# Patient Record
Sex: Female | Born: 1970 | Hispanic: Yes | Marital: Married | Smoking: Never smoker
Health system: Southern US, Community
[De-identification: ages and names within clinical notes are randomized; demographics above are authoritative.]

## PROBLEM LIST (undated history)

## (undated) DIAGNOSIS — E669 Obesity, unspecified: Secondary | ICD-10-CM

## (undated) DIAGNOSIS — G43909 Migraine, unspecified, not intractable, without status migrainosus: Secondary | ICD-10-CM

## (undated) DIAGNOSIS — I639 Cerebral infarction, unspecified: Secondary | ICD-10-CM

## (undated) DIAGNOSIS — K219 Gastro-esophageal reflux disease without esophagitis: Secondary | ICD-10-CM

## (undated) HISTORY — PX: TUBAL LIGATION: SHX77

## (undated) HISTORY — DX: Cerebral infarction, unspecified: I63.9

## (undated) HISTORY — PX: CHOLECYSTECTOMY: SHX55

---

## 2008-02-03 ENCOUNTER — Ambulatory Visit: Payer: Self-pay | Admitting: *Deleted

## 2008-02-03 DIAGNOSIS — E669 Obesity, unspecified: Secondary | ICD-10-CM | POA: Insufficient documentation

## 2008-02-03 DIAGNOSIS — K219 Gastro-esophageal reflux disease without esophagitis: Secondary | ICD-10-CM | POA: Insufficient documentation

## 2008-02-03 LAB — CONVERTED CEMR LAB: TSH: 0.69 microintl units/mL (ref 0.35–5.50)

## 2008-03-22 ENCOUNTER — Encounter: Payer: Self-pay | Admitting: Family Medicine

## 2008-03-22 ENCOUNTER — Ambulatory Visit: Payer: Self-pay | Admitting: Family Medicine

## 2008-03-22 DIAGNOSIS — R209 Unspecified disturbances of skin sensation: Secondary | ICD-10-CM | POA: Insufficient documentation

## 2008-03-23 LAB — CONVERTED CEMR LAB
Folate: 19.1 ng/mL
HCT: 45 % (ref 36.0–46.0)
Hemoglobin: 14.5 g/dL (ref 12.0–15.0)
MCHC: 32.2 g/dL (ref 30.0–36.0)
MCV: 89.1 fL (ref 78.0–100.0)
RBC: 5.05 M/uL (ref 3.87–5.11)
Vit D, 1,25-Dihydroxy: 13 — ABNORMAL LOW (ref 30–89)
WBC: 9.6 10*3/uL (ref 4.0–10.5)

## 2008-03-27 ENCOUNTER — Encounter: Admission: RE | Admit: 2008-03-27 | Discharge: 2008-03-27 | Payer: Self-pay | Admitting: Family Medicine

## 2008-03-31 ENCOUNTER — Ambulatory Visit: Payer: Self-pay | Admitting: Family Medicine

## 2008-04-10 ENCOUNTER — Ambulatory Visit: Payer: Self-pay | Admitting: Family Medicine

## 2008-04-27 ENCOUNTER — Ambulatory Visit: Payer: Self-pay | Admitting: Family Medicine

## 2008-04-27 DIAGNOSIS — G43109 Migraine with aura, not intractable, without status migrainosus: Secondary | ICD-10-CM | POA: Insufficient documentation

## 2008-06-08 ENCOUNTER — Ambulatory Visit: Payer: Self-pay | Admitting: Family Medicine

## 2008-07-19 ENCOUNTER — Telehealth: Payer: Self-pay | Admitting: Family Medicine

## 2008-07-19 DIAGNOSIS — E559 Vitamin D deficiency, unspecified: Secondary | ICD-10-CM | POA: Insufficient documentation

## 2008-08-23 ENCOUNTER — Encounter: Payer: Self-pay | Admitting: Family Medicine

## 2009-03-13 ENCOUNTER — Other Ambulatory Visit: Admission: RE | Admit: 2009-03-13 | Discharge: 2009-03-13 | Payer: Self-pay | Admitting: Family Medicine

## 2009-03-13 ENCOUNTER — Ambulatory Visit: Payer: Self-pay | Admitting: Family Medicine

## 2009-03-13 ENCOUNTER — Encounter: Payer: Self-pay | Admitting: Family Medicine

## 2009-03-14 ENCOUNTER — Encounter: Payer: Self-pay | Admitting: Family Medicine

## 2009-03-15 ENCOUNTER — Encounter: Payer: Self-pay | Admitting: Family Medicine

## 2009-03-15 LAB — CONVERTED CEMR LAB
ALT: 66 units/L — ABNORMAL HIGH (ref 0–35)
Alkaline Phosphatase: 68 units/L (ref 39–117)
Calcium: 9.4 mg/dL (ref 8.4–10.5)
Creatinine, Ser: 0.66 mg/dL (ref 0.40–1.20)
LDL Cholesterol: 120 mg/dL — ABNORMAL HIGH (ref 0–99)
Potassium: 4.3 meq/L (ref 3.5–5.3)
Total CHOL/HDL Ratio: 3.5
Total Protein: 7.2 g/dL (ref 6.0–8.3)

## 2009-03-17 LAB — CONVERTED CEMR LAB
HCV Ab: NEGATIVE
Hep B C IgM: NEGATIVE

## 2009-03-19 LAB — CONVERTED CEMR LAB: Pap Smear: NORMAL

## 2009-03-21 ENCOUNTER — Encounter: Payer: Self-pay | Admitting: Family Medicine

## 2009-03-22 ENCOUNTER — Encounter: Payer: Self-pay | Admitting: Family Medicine

## 2009-03-23 LAB — CONVERTED CEMR LAB
ALT: 21 units/L (ref 0–35)
AST: 15 units/L (ref 0–37)

## 2009-05-23 ENCOUNTER — Ambulatory Visit: Payer: Self-pay | Admitting: Family Medicine

## 2009-05-23 DIAGNOSIS — Q828 Other specified congenital malformations of skin: Secondary | ICD-10-CM | POA: Insufficient documentation

## 2009-07-09 ENCOUNTER — Ambulatory Visit: Payer: Self-pay | Admitting: Family Medicine

## 2009-07-09 DIAGNOSIS — K648 Other hemorrhoids: Secondary | ICD-10-CM | POA: Insufficient documentation

## 2009-12-10 ENCOUNTER — Telehealth: Payer: Self-pay | Admitting: Family Medicine

## 2010-01-31 ENCOUNTER — Telehealth: Payer: Self-pay | Admitting: Family Medicine

## 2010-04-19 ENCOUNTER — Other Ambulatory Visit
Admission: RE | Admit: 2010-04-19 | Discharge: 2010-04-19 | Payer: Self-pay | Source: Home / Self Care | Admitting: Family Medicine

## 2010-04-19 ENCOUNTER — Ambulatory Visit: Payer: Self-pay | Admitting: Family Medicine

## 2010-04-24 LAB — CONVERTED CEMR LAB
Pap Smear: NEGATIVE
Pap Smear: NORMAL

## 2010-05-24 ENCOUNTER — Ambulatory Visit
Admission: RE | Admit: 2010-05-24 | Discharge: 2010-05-24 | Payer: Self-pay | Source: Home / Self Care | Attending: Family Medicine | Admitting: Family Medicine

## 2010-05-24 DIAGNOSIS — M25569 Pain in unspecified knee: Secondary | ICD-10-CM | POA: Insufficient documentation

## 2010-06-18 NOTE — Progress Notes (Signed)
Summary: change  bcp  Phone Note Call from Patient Call back at [phone]   Caller: Patient Call For: Nani Gasser MD Summary of Call: pt needs a refill of her BCP but wants to switch to another type Initial call taken by: Avon Gully CMA, Duncan Dull),  December 10, 2009 9:26 AM  Follow-up for Phone Call        That is fine. Does she have a preference or is she having problems with her current pill?   Follow-up by: Nani Gasser MD,  December 10, 2009 12:25 PM  Additional Follow-up for Phone Call Additional follow up Details #1::        pt does not know the name of the BCP she had in the past. It was before she was a pt at this office. Pt wants a pill that allows her to have regular full periods. states"wants old fashion" bcp.send to [facility] Additional Follow-up by: Avon Gully CMA, Duncan Dull),  December 10, 2009 12:53 PM    New/Updated Medications: SPRINTEC 28 0.25-35 MG-MCG TABS (NORGESTIMATE-ETH ESTRADIOL) Take 1 tablet by mouth once a day Prescriptions: SPRINTEC 28 0.25-35 MG-MCG TABS (NORGESTIMATE-ETH ESTRADIOL) Take 1 tablet by mouth once a day  #1 pack x 6   Entered and Authorized by:   Nani Gasser MD   Signed by:   Nani Gasser MD on 12/10/2009   Method used:   Electronically to        [facility] [id number] (retail)       [address]       [place], [place]  [address]       Ph: [phone] or [phone]       Fax: [phone]   RxID:   [id number]

## 2010-06-18 NOTE — Letter (Signed)
Summary: Out of Work  [facility]  [address], [address]   [place], [place] [address]   Phone: [phone]  Fax: [phone]    May 23, 2009   Employee:  Brandy Brady    To Whom It May Concern:   For Medical reasons, please excuse the above named employee from work for the following dates:  Start:   Jan 5-6  End:   Jan 7  If you need additional information, please feel free to contact our office.         Sincerely,    Seymour Bars DO

## 2010-06-18 NOTE — Assessment & Plan Note (Signed)
Summary: Cpe W/Pap   Vital Signs:  Patient profile:   40 year old female Menstrual status:  regular Height:      64.5 inches Weight:      225 pounds Pulse rate:   79 / minute BP sitting:   130 / 84  (right arm) Cuff size:   large  Vitals Entered By: Avon Gully CMA, Duncan Dull) (April 19, 2010 1:23 PM) CC: CPE and pap   Primary Care Shakai Dolley:  Nani Gasser MD  CC:  CPE and pap.  History of Present Illness: No complaints. She is trying to get pregnant. She has 3 kids and has been off OCPs for 2 months thus far. Periods are regular.   Current Medications (verified): 1)  None  Allergies (verified): No Known Drug Allergies  Comments:  Nurse/Medical Assistant: The patient's medications and allergies were reviewed with the patient and were updated in the Medication and Allergy Lists. Avon Gully CMA, Duncan Dull) (April 19, 2010 1:24 PM)  Past History:  Past Medical History: Last updated: 07/09/2009 migraines GERD Peptic ulcer disease Keratosis pilaris.  Colonoscopy/endoscopy 1990s   Past Surgical History: Last updated: 03/22/2008 Cholecystectomy 02/1999  Family History: Last updated: 03/13/2009 Family History of Arthritis Family History Diabetes 1st degree relative - Father  Family History Hypertension Family History Lung cancer, mom died at age 18.  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  Bachelors degree in [employer].  Married to [employer] with 3 kids.   3 children Never Smoked Alcohol use-no Drug use-no Regular exercise-no  Review of Systems  The patient denies anorexia, fever, weight loss, weight gain, vision loss, decreased hearing, hoarseness, chest pain, syncope, dyspnea on exertion, peripheral edema, prolonged cough, headaches, hemoptysis, abdominal pain, melena, hematochezia, severe indigestion/heartburn, hematuria, incontinence, genital sores, muscle  weakness, suspicious skin lesions, transient blindness, difficulty walking, depression, unusual weight change, abnormal bleeding, enlarged lymph nodes, and breast masses.    Physical Exam  General:  Well-developed,well-nourished,in no acute distress; alert,appropriate and cooperative throughout examination Head:  Normocephalic and atraumatic without obvious abnormalities. No apparent alopecia or balding. Eyes:  No corneal or conjunctival inflammation noted. EOMI. Perrla. Ears:  External ear exam shows no significant lesions or deformities.  Otoscopic examination reveals clear canals, tympanic membranes are intact bilaterally without bulging, retraction, inflammation or discharge. Hearing is grossly normal bilaterally. Nose:  External nasal examination shows no deformity or inflammation.  Mouth:  Oral mucosa and oropharynx without lesions or exudates.  Teeth in good repair. Neck:  No deformities, masses, or tenderness noted. Chest Wall:  No deformities, masses, or tenderness noted. Breasts:  No mass, nodules, thickening, tenderness, bulging, retraction, inflamation, nipple discharge or skin changes noted.   Lungs:  Normal respiratory effort, chest expands symmetrically. Lungs are clear to auscultation, no crackles or wheezes. Heart:  Normal rate and regular rhythm. S1 and S2 normal without gallop, murmur, click, rub or other extra sounds. Abdomen:  Bowel sounds positive,abdomen soft and non-tender without masses, organomegaly or hernias noted. Genitalia:  Normal introitus for age, no external lesions, no vaginal discharge, mucosa pink and moist, no vaginal or cervical lesions, no vaginal atrophy, no friaility or hemorrhage, normal uterus size and position, no adnexal masses or tenderness Msk:  No deformity or scoliosis noted of thoracic or lumbar spine.   Pulses:  R and L carotid,radial,dorsalis pedis and posterior tibial pulses are full and equal bilaterally Extremities:  No clubbing, cyanosis,  edema, or deformity noted  with normal full range of motion of all joints.   Neurologic:  No cranial nerve deficits noted. Station and gait are normal.. Sensory, motor and coordinative functions appear intact. Skin:  no rashes.   Cervical Nodes:  No lymphadenopathy noted Axillary Nodes:  No palpable lymphadenopathy Psych:  Cognition and judgment appear intact. Alert and cooperative with normal attention span and concentration. No apparent delusions, illusions, hallucinations   Impression & Recommendations:  Problem # 1:  ROUTINE GYNECOLOGICAL EXAMINATION (ICD-V72.31) Rec daily folic acid to prevent birth defects Due for screening labs.  Encouraged regular exercise.  If pap is normal then repeat in 3 years.  Orders: T-Comprehensive Metabolic Panel [id number]) T-Lipid Profile [id number])  Other Orders: T-Vitamin D (25-Hydroxy) [id number])  Patient Instructions: 1)  Make sure taking folic acid daily to help prevent neural tube defects.   2)  Regular exercise is good for you heart 3)  We will call you with your lab results when we get these back.    Orders Added: 1)  Est. Patient age 35-39 [59] 2)  T-Comprehensive Metabolic Panel [[id number]] 3)  T-Lipid Profile [id number] 4)  T-Vitamin D (25-Hydroxy) [phone]

## 2010-06-18 NOTE — Assessment & Plan Note (Signed)
Summary: pink eye/   Vital Signs:  Patient profile:   40 year old female Menstrual status:  regular Height:      64.5 inches Weight:      215 pounds BMI:     36.47 O2 Sat:      100 % on Room air Temp:     98.8 degrees F oral Pulse rate:   83 / minute BP sitting:   132 / 88  (left arm) Cuff size:   large  Vitals Entered By: Payton Spark CMA (May 23, 2009 1:43 PM)  O2 Flow:  Room air CC: L eye infection x 1 day   Primary Care Provider:  Nani Gasser MD  CC:  L eye infection x 1 day.  History of Present Illness: L eye started tearing and getting red yesterday, now in the R eye.  Her eye was swollen and sealed shut this morning.  She went to work today but she left her job today.  No pink eye at home.  She recent URI syptoms.  She does not wear contacts.  Denies foreign bodies.  Has some yellow discharge in the morning.    Current Medications (verified): 1)  Cryselle-28 0.3-30 Mg-Mcg Tabs (Norgestrel-Ethinyl Estradiol) .... Take 1 Tablet By Mouth Once A Day 2)  Treximet 85-500 Mg Tabs (Sumatriptan-Naproxen Sodium) .... Take 1 Tablet By Mouth Once A Day As Needed Migraine 3)  Vitamin D 1000 Unit Tabs (Cholecalciferol) .... Take 1 Tablet By Mouth Once A Day  Allergies (verified): No Known Drug Allergies  Past History:  Past Medical History: Reviewed history from 03/22/2008 and no changes required. migraines GERD Peptic ulcer disease Keratosis pilaris.   Social History: Reviewed history from 03/22/2008 and no changes required. 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  Bachelors degree in [employer].  Married to [employer] with 3 kids.   3 children Never Smoked Alcohol use-no Drug use-no Regular exercise-no  Review of Systems      See HPI  Physical Exam  General:  alert, well-developed, well-nourished, and well-hydrated.   Head:  normocephalic and atraumatic.   Eyes:  global injection to both eyes with minimal L upper eyelid edema.  No mucous discharge. EOMI.   PERRLA.  wears glasses.   Ears:  no external deformities.   Nose:  no nasal discharge.   Mouth:  pharynx pink and moist.   Neck:  no masses.   Lungs:  Normal respiratory effort, chest expands symmetrically. Lungs are clear to auscultation, no crackles or wheezes. Heart:  Normal rate and regular rhythm. S1 and S2 normal without gallop, murmur, click, rub or other extra sounds. Skin:  keratosis pilaris upper arms   Impression & Recommendations:  Problem # 1:  UNSPECIFIED ACUTE CONJUNCTIVITIS (ICD-372.00) Treat bilateral acute conjunctivitis with Tobradex drops 4 x a day, warm moist compresses.  AVoid contact spread.  Problem # 2:  KERATOSIS PILARIS (ICD-757.39) Recommended trying Lac- Hydrin cream.    Complete Medication List: 1)  Cryselle-28 0.3-30 Mg-mcg Tabs (Norgestrel-ethinyl estradiol) .... Take 1 tablet by mouth once a day 2)  Treximet 85-500 Mg Tabs (Sumatriptan-naproxen sodium) .... Take 1 tablet by mouth once a day as needed migraine 3)  Vitamin D 1000 Unit Tabs (Cholecalciferol) .... Take 1 tablet by mouth once a day 4)  Tobramycin-dexamethasone 0.3-0.1 % Susp (Tobramycin-dexamethasone) .Marland Kitchen.. 1-2 drops in both eyes 4 x a day x 1 wk  Patient Instructions: 1)  Start on Tobradex drops 4 x a day for pink eye. 2)  Use warm  moist compresses. 3)  Take tomorrow off if pink eye is not improving.   Prescriptions: TOBRAMYCIN-DEXAMETHASONE 0.3-0.1 % SUSP (TOBRAMYCIN-DEXAMETHASONE) 1-2 drops in both eyes 4 x a day x 1 wk  #1 bottle x 0   Entered and Authorized by:   Seymour Bars DO   Signed by:   Seymour Bars DO on 05/23/2009   Method used:   Electronically to        [facility] [address] (retail)       [address] [place], [place]  [address]       Ph: [phone] or [phone]       Fax: [phone]   RxID:   [id number]

## 2010-06-18 NOTE — Assessment & Plan Note (Signed)
Summary: BLEEDING After BM   Vital Signs:  Patient profile:   40 year old female Menstrual status:  regular Height:      64.5 inches Weight:      220 pounds Pulse rate:   66 / minute BP sitting:   127 / 79  (left arm) Cuff size:   large  Vitals Entered By: Kathlene November (July 09, 2009 12:51 PM) CC: has hx of internal hemorroids and for the last 1 1/2 weeks has had bright red blood in stool, on paper when wipes and in the toilet water   Primary Care Provider:  Nani Gasser MD  CC:  has hx of internal hemorroids and for the last 1 1/2 weeks has had bright red blood in stool and on paper when wipes and in the toilet water.  History of Present Illness: has hx of internal hemorroids and for the last 1 1/2 weeks has had bright red blood in stool, on paper when wipes and in the toilet water.  Has noticed drops in the water.  Has had to strain lately with BMs.  With her new job has been not able to drink as much.  No major diet changes.  No pain w/ the bleeding.    Current Medications (verified): 1)  Cryselle-28 0.3-30 Mg-Mcg Tabs (Norgestrel-Ethinyl Estradiol) .... Take 1 Tablet By Mouth Once A Day 2)  Vitamin B-12 1000 Mcg Tabs (Cyanocobalamin) .... Take One Tablet By Mouth Daily  Allergies (verified): No Known Drug Allergies  Comments:  Nurse/Medical Assistant: The patient's medications and allergies were reviewed with the patient and were updated in the Medication and Allergy Lists. Kathlene November (July 09, 2009 12:53 PM)  Past History:  Past Medical History: migraines GERD Peptic ulcer disease Keratosis pilaris.  Colonoscopy/endoscopy 1990s   Physical Exam  General:  Well-developed,well-nourished,in no acute distress; alert,appropriate and cooperative throughout examination Head:  Normocephalic and atraumatic without obvious abnormalities. No apparent alopecia or balding. Rectal:  + exteranl and internal hemorrhoids but no active bleeding. Hemoccult +. Digital  exam was nromal.    Impression & Recommendations:  Problem # 1:  HEMORRHOIDS, INTERNAL (ICD-455.0) Use topical steroid cream for relief. Also will work on softening stools. Start colace and increase water intake.  Pt to call on Thrusday if not better. If not better then will schedule with GI.   Complete Medication List: 1)  Cryselle-28 0.3-30 Mg-mcg Tabs (Norgestrel-ethinyl estradiol) .... Take 1 tablet by mouth once a day 2)  Vitamin B-12 1000 Mcg Tabs (Cyanocobalamin) .... Take one tablet by mouth daily 3)  Proctosol Hc 2.5 % Crea (Hydrocortisone) .... Apply cream two times a day or after bm.   Patient Instructions: 1)  Colace one with each meal with a glass a water.  2)  If not better by Thursday morning then please call.  Prescriptions: PROCTOSOL HC 2.5 % CREA (HYDROCORTISONE) Apply cream two times a day or after BM.  #1 tube x 0   Entered and Authorized by:   Nani Gasser MD   Signed by:   Nani Gasser MD on 07/09/2009   Method used:   Electronically to        [facility] [address] (retail)       [address] [place], [place]  [address]       Ph: [phone] or [phone]       Fax: [phone]   RxID:  [id number]   Appended Document: BLEEDING After BM HPI: No abdominal pain, nausae or vomiting.

## 2010-06-18 NOTE — Progress Notes (Signed)
Summary: meds  Phone Note Call from Patient   Caller: Patient Call For: Nani Gasser MD Summary of Call: pt wants to know if she should take a folic acid along with her prenatle vitamin because she is trying to have another child Initial call taken by: Avon Gully CMA, Duncan Dull),  January 31, 2010 11:24 AM  Follow-up for Phone Call        Veryl Speak the prenatal has enough folic acid so should be OK but it wants to take additional folic acid certainly won't hurt.  Follow-up by: Nani Gasser MD,  January 31, 2010 12:01 PM  Additional Follow-up for Phone Call Additional follow up Details #1::        pt notified via vm Additional Follow-up by: Avon Gully CMA, Duncan Dull),  January 31, 2010 12:10 PM

## 2010-06-20 NOTE — Assessment & Plan Note (Signed)
Summary: PAIN IN RIGHT KNEE   Vital Signs:  Patient profile:   40 year old female Menstrual status:  regular Height:      64.5 inches Weight:      227 pounds Pulse rate:   84 / minute BP sitting:   125 / 82  (right arm) Cuff size:   large  Vitals Entered By: Avon Gully CMA, Duncan Dull) (May 24, 2010 2:44 PM) CC: rt knee pain since x-mas,tried Aleve   Primary Care Provider:  Nani Gasser MD  CC:  rt knee pain since x-mas and tried Aleve.  History of Present Illness: rt knee pain since x-mas,tried Aleve. No recent trauma or falls. Has hurt in the past and would feel a sharp pain when pushes her wheeled chair.  Suddenly once day stoodup around the [facility] and the pain was excruitiating.  Got better with rest but this week has been worse.  Sharp movements make it worse.  Pain at the base of hte patella . No swelling. No giving out or locks.  Kayren Eaves down the step is more painful then going up the stairs.  Takes an Aleve when needed and helps some. No prior known injury  Current Medications (verified): 1)  None  Allergies (verified): No Known Drug Allergies  Comments:  Nurse/Medical Assistant: The patient's medications and allergies were reviewed with the patient and were updated in the Medication and Allergy Lists. Avon Gully CMA, Duncan Dull) (May 24, 2010 2:46 PM)  Physical Exam  General:  Well-developed,well-nourished,in no acute distress; alert,appropriate and cooperative throughout examination Msk:  Slight tender over the distal end of the patella. No joiint line tendnerness. Neg patellar compresssion test. Nroma flexion, extension. Strength 5/5 bilat at the knee, ankle.  No laxity. No pain with varus and valgus.     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  She may also have some excessive mobility of the knee cap. Discussed dx. Given H.O on stretches. NSAID, elevated, rest and  ice. F/U in 3-4 weeks if not getting some better. Consider PT. She would like to try home exercises first.  Her updated medication list for this problem includes:    Aleve 220 Mg Tabs (Naproxen sodium)  Complete Medication List: 1)  Aleve 220 Mg Tabs (Naproxen sodium)  Patient Instructions: 1)  Icey massage 2)  Aleve two times a day with fooda nd water. 3)  Start the exercises on the handout. 4)  If not significan improvement in 4 weeks then let me know and we can get an xray.    Orders Added: 1)  Est. Patient Level IV [[address]]

## 2010-06-25 ENCOUNTER — Encounter: Payer: Self-pay | Admitting: Family Medicine

## 2010-06-26 LAB — CONVERTED CEMR LAB
ALT: 19 units/L (ref 0–35)
AST: 17 units/L (ref 0–37)
Chloride: 104 meq/L (ref 96–112)
Cholesterol: 181 mg/dL (ref 0–200)
Potassium: 4.3 meq/L (ref 3.5–5.3)
Total CHOL/HDL Ratio: 3.2
Vit D, 25-Hydroxy: 14 ng/mL — ABNORMAL LOW (ref 30–89)

## 2010-07-15 ENCOUNTER — Telehealth: Payer: Self-pay | Admitting: Family Medicine

## 2010-07-25 NOTE — Progress Notes (Signed)
Summary: [facility]--Positive UPT  Phone Note Call from Patient Call back at Home Phone [phone]   Caller: Patient Call For: Nani Gasser MD Summary of Call: Pt had positive UPT this weekend.  Pt would like to know if she needs to make appt with you or if she just needs to schedule with OB/GYN.  Please advise. Initial call taken by: Francee Piccolo CMA Duncan Dull),  July 15, 2010 3:58 PM  Follow-up for Phone Call        Can sched with OB. Tell her congrats.  Follow-up by: Nani Gasser MD,  July 15, 2010 4:38 PM  Additional Follow-up for Phone Call Additional follow up Details #1::        message left with above information.   Additional Follow-up by: Francee Piccolo CMA Duncan Dull),  July 15, 2010 5:23 PM

## 2010-07-26 ENCOUNTER — Other Ambulatory Visit: Payer: Self-pay | Admitting: Family Medicine

## 2010-07-26 ENCOUNTER — Ambulatory Visit (INDEPENDENT_AMBULATORY_CARE_PROVIDER_SITE_OTHER): Payer: Commercial Indemnity | Admitting: Family Medicine

## 2010-07-26 ENCOUNTER — Encounter: Payer: Self-pay | Admitting: Family Medicine

## 2010-07-26 ENCOUNTER — Ambulatory Visit: Payer: Self-pay | Admitting: Family Medicine

## 2010-07-26 DIAGNOSIS — O3680X0 Pregnancy with inconclusive fetal viability, not applicable or unspecified: Secondary | ICD-10-CM

## 2010-07-26 DIAGNOSIS — Z331 Pregnant state, incidental: Secondary | ICD-10-CM

## 2010-07-28 ENCOUNTER — Inpatient Hospital Stay (HOSPITAL_COMMUNITY)
Admission: AD | Admit: 2010-07-28 | Discharge: 2010-07-28 | Disposition: A | Discharge status: Home / Self Care | Payer: Commercial Indemnity | Source: Ambulatory Visit | Attending: Obstetrics and Gynecology | Admitting: Obstetrics and Gynecology

## 2010-07-28 ENCOUNTER — Inpatient Hospital Stay (HOSPITAL_COMMUNITY): Payer: Commercial Indemnity

## 2010-07-28 DIAGNOSIS — O209 Hemorrhage in early pregnancy, unspecified: Secondary | ICD-10-CM | POA: Insufficient documentation

## 2010-07-28 LAB — CBC
MCH: 29.3 pg (ref 26.0–34.0)
MCHC: 32.8 g/dL (ref 30.0–36.0)
RBC: 4.61 MIL/uL (ref 3.87–5.11)
WBC: 5.4 10*3/uL (ref 4.0–10.5)

## 2010-07-28 LAB — WET PREP, GENITAL
Clue Cells Wet Prep HPF POC: NONE SEEN
Yeast Wet Prep HPF POC: NONE SEEN

## 2010-07-29 ENCOUNTER — Ambulatory Visit (HOSPITAL_COMMUNITY): Admission: RE | Admit: 2010-07-29 | Payer: Commercial Indemnity | Source: Ambulatory Visit

## 2010-07-30 ENCOUNTER — Inpatient Hospital Stay (HOSPITAL_COMMUNITY)
Admission: AD | Admit: 2010-07-30 | Discharge: 2010-07-30 | Disposition: A | Discharge status: Home / Self Care | Payer: Commercial Indemnity | Source: Ambulatory Visit | Attending: Obstetrics & Gynecology | Admitting: Obstetrics & Gynecology

## 2010-07-30 DIAGNOSIS — O209 Hemorrhage in early pregnancy, unspecified: Secondary | ICD-10-CM

## 2010-07-30 LAB — GC/CHLAMYDIA PROBE AMP, GENITAL
Chlamydia, DNA Probe: NEGATIVE
GC Probe Amp, Genital: NEGATIVE

## 2010-07-30 LAB — HCG, QUANTITATIVE, PREGNANCY: hCG, Beta Chain, Quant, S: 177 m[IU]/mL — ABNORMAL HIGH (ref <5)

## 2010-07-30 NOTE — Assessment & Plan Note (Signed)
Summary:  spotting  while [redacted] weeks pregnant   Vital Signs:  Patient profile:   40 year old female Menstrual status:  regular LMP:     06/18/2010 Height:      64.5 inches Weight:      223 pounds Pulse rate:   61 / minute BP sitting:   132 / 88  (right arm) Cuff size:   large  Vitals Entered By: Avon Gully CMA, Duncan Dull) (July 26, 2010 2:27 PM) CC: pt concerned she is having a miscarrage, wipped bld on tissue LMP (date): 06/18/2010 LMP - Character: light Menarche (age onset years): 10   Menses interval (days): 28 Menstrual flow (days): 7 Enter LMP: 06/18/2010 Last PAP Result Normal   Primary Care Provider:  Nani Gasser MD  CC:  pt concerned she is having a miscarrage and wipped bld on tissue.  History of Present Illness: LMP was last week of Jan. Thought was going to change OB.  Hasn't seen OB for the pregnanty.  Little red passage of fluids when went to the bathroom today. No cramping.She is 5 weeks 3 days today.  she has not had any vaginal or abdominal pain. she denies any vaginal discharge.  Current Medications (verified): 1)  None  Allergies (verified): No Known Drug Allergies  Comments:  Nurse/Medical Assistant: The patient's medications and allergies were reviewed with the patient and were updated in the Medication and Allergy Lists. Avon Gully CMA, Duncan Dull) (July 26, 2010 2:29 PM)  Physical Exam  General:  Well-developed,well-nourished,in no acute distress; alert,appropriate and cooperative throughout examination   Impression & Recommendations:  Problem # 1:  PREGNANCY (ICD-V22.2)  I discussed with patient that it is possible that she may be losing the pregnancy. I certainly at this point she is really bad she should not be at risk of hemorrhage et Karie Soda. that I let her know she starts having any heavy bleeding over the weekend she is to go to [facility] for [facility] evaluation. We will schedule a pelvic ultrasound on Monday to see if  there is still a viable pregnancy in place. unfortunately she is very disappointed that she may lose this pregnancy I reassured her that sometimes spotting can be normal in the early part of pregnancy. Orders: T-*Unlisted Diagnostic X-ray test/procedure [id number])   Orders Added: 1)  T-*Unlisted Diagnostic X-ray test/procedure [[id number]] 2)  Est. Patient Level III [[address]]

## 2010-08-01 ENCOUNTER — Inpatient Hospital Stay (HOSPITAL_COMMUNITY)
Admission: AD | Admit: 2010-08-01 | Discharge: 2010-08-01 | Disposition: A | Discharge status: Home / Self Care | Payer: Commercial Indemnity | Source: Ambulatory Visit | Attending: Family Medicine | Admitting: Family Medicine

## 2010-08-01 DIAGNOSIS — O039 Complete or unspecified spontaneous abortion without complication: Secondary | ICD-10-CM | POA: Insufficient documentation

## 2010-08-01 LAB — HCG, QUANTITATIVE, PREGNANCY: hCG, Beta Chain, Quant, S: 24 m[IU]/mL — ABNORMAL HIGH (ref <5)

## 2010-08-08 ENCOUNTER — Inpatient Hospital Stay (HOSPITAL_COMMUNITY)
Admission: AD | Admit: 2010-08-08 | Discharge: 2010-08-08 | Disposition: A | Discharge status: Home / Self Care | Payer: Commercial Indemnity | Source: Ambulatory Visit | Attending: Obstetrics and Gynecology | Admitting: Obstetrics and Gynecology

## 2010-08-08 DIAGNOSIS — O039 Complete or unspecified spontaneous abortion without complication: Secondary | ICD-10-CM | POA: Insufficient documentation

## 2010-08-08 LAB — HCG, QUANTITATIVE, PREGNANCY: hCG, Beta Chain, Quant, S: 2 m[IU]/mL (ref <5)

## 2010-08-18 DEATH — deceased

## 2013-04-05 ENCOUNTER — Ambulatory Visit: Payer: Commercial Indemnity | Admitting: Family Medicine

## 2013-04-05 DIAGNOSIS — Z0289 Encounter for other administrative examinations: Secondary | ICD-10-CM

## 2013-09-09 ENCOUNTER — Ambulatory Visit (INDEPENDENT_AMBULATORY_CARE_PROVIDER_SITE_OTHER): Payer: BC Managed Care – PPO | Admitting: Family Medicine

## 2013-09-09 ENCOUNTER — Encounter: Payer: Self-pay | Admitting: Family Medicine

## 2013-09-09 VITALS — BP 117/76 | HR 63 | Ht 63.25 in | Wt 179.0 lb

## 2013-09-09 DIAGNOSIS — R5381 Other malaise: Secondary | ICD-10-CM

## 2013-09-09 DIAGNOSIS — M545 Low back pain, unspecified: Secondary | ICD-10-CM

## 2013-09-09 DIAGNOSIS — R5383 Other fatigue: Principal | ICD-10-CM

## 2013-09-09 DIAGNOSIS — K648 Other hemorrhoids: Secondary | ICD-10-CM

## 2013-09-09 MED ORDER — NORETHIN ACE-ETH ESTRAD-FE 1-20 MG-MCG(24) PO CHEW: 1.0000 | CHEWABLE_TABLET | Freq: Every day | ORAL | Status: DC

## 2013-09-09 NOTE — Patient Instructions (Signed)

## 2013-09-09 NOTE — Progress Notes (Signed)
   Subjective:    Patient ID: Brandy Brady, female    DOB: [birth year], 43 y.o.   MRN: [id number]  HPI Low back pain - Says occ hurts on the left low back. Will occ feel a knot. No specific triggers. It's not hurting today. No real alleviating factors. Sent to get better after a few days on its own. No old injuries or trauma.  Has some problems with constipation esp during pregnancy.  Still some intermittant constipation.  She has to drink a lot of water.  Will occ see bright red blood.   She has a known history of internal hemorrhoids and feels like ever since her pregnancy that they have been irritated and inflamed.  Fatigue - Has been tired in general.   Now has a little boy. Now 16 month.  He was 6 weeks premature.   Needs refill on birth control. Plans to schedule a Pap smear. Her last pelvic exam was about 16 months ago when her son was born. She's happy with her current birth control pill. It has regulated her periods well.   Review of Systems  Constitutional: Negative for fever, diaphoresis and unexpected weight change.  HENT: Negative for hearing loss, postnasal drip, sneezing and tinnitus.   Eyes: Negative for visual disturbance.  Respiratory: Negative for cough and wheezing.   Cardiovascular: Negative for chest pain and palpitations.  Genitourinary: Negative for vaginal bleeding and vaginal discharge.       Blood in BM  Musculoskeletal: Positive for myalgias. Negative for arthralgias.  Neurological: Positive for headaches.  Hematological: Negative for adenopathy. Does not bruise/bleed easily.  Psychiatric/Behavioral:       Sexual dysfunction.        Objective:   Physical Exam  Constitutional: She is oriented to person, place, and time. She appears well-developed and well-nourished.  HENT:  Head: Normocephalic and atraumatic.  Cardiovascular: Normal rate, regular rhythm and normal heart sounds.   Pulmonary/Chest: Effort normal and breath sounds normal.   Musculoskeletal:  Lumbar spine with normal flexion, extension, rotation right and left, side bending. She's very tender over the left SI joint. Nontender over the lumbar spine. Negative straight leg raise bilaterally.  Neurological: She is alert and oriented to person, place, and time.  Skin: Skin is warm and dry.  Psychiatric: She has a normal mood and affect. Her behavior is normal.          Assessment & Plan:  Low back pain-SI joint inflammation and muscular skeletal strain. Recommend anti-inflammatory as needed. Also provided handouts and gentle stretches from her back. Suspect that the inflammation is secondary to her lifting her 7478-month-old. In carrying him on her hip. If suddenly gets worse then please let us know.  Internal hemorrhoids- gave reassurance. Continue work on keeping stools consistent as this does help. Making sure drinking plenty of fluids. If he gets to the point where continues to be bothersome consider surgical evaluation for banding or hemorrhoidectomy.  Fatigue - will check labs to anemia and thyroidd. Slip provided today.  She will schedule a physical w/ pap in the next couple weeks. Birth control refill today.

## 2013-09-23 ENCOUNTER — Encounter: Payer: BC Managed Care – PPO | Admitting: Family Medicine

## 2013-09-29 ENCOUNTER — Encounter: Payer: BC Managed Care – PPO | Admitting: Family Medicine

## 2013-09-29 DIAGNOSIS — Z0289 Encounter for other administrative examinations: Secondary | ICD-10-CM

## 2013-10-20 ENCOUNTER — Other Ambulatory Visit: Payer: Self-pay | Admitting: Family Medicine

## 2013-10-20 DIAGNOSIS — Z139 Encounter for screening, unspecified: Secondary | ICD-10-CM

## 2013-10-21 LAB — COMPLETE METABOLIC PANEL WITH GFR
ALT: 9 U/L (ref 0–35)
AST: 13 U/L (ref 0–37)
Albumin: 4.2 g/dL (ref 3.5–5.2)
Alkaline Phosphatase: 55 U/L (ref 39–117)
BUN: 14 mg/dL (ref 6–23)
CALCIUM: 9.6 mg/dL (ref 8.4–10.5)
CO2: 23 mEq/L (ref 19–32)
CREATININE: 0.7 mg/dL (ref 0.50–1.10)
Chloride: 104 mEq/L (ref 96–112)
GFR, Est Non African American: >89 mL/min
Glucose, Bld: 99 mg/dL (ref 70–99)
POTASSIUM: 4.3 meq/L (ref 3.5–5.3)
SODIUM: 139 meq/L (ref 135–145)
Total Bilirubin: 0.7 mg/dL (ref 0.2–1.2)
Total Protein: 7.1 g/dL (ref 6.0–8.3)

## 2013-10-21 LAB — LIPID PANEL
CHOLESTEROL: 163 mg/dL (ref 0–200)
HDL: 60 mg/dL (ref >39)
LDL CALC: 77 mg/dL (ref 0–99)
TRIGLYCERIDES: 128 mg/dL (ref <150)
Total CHOL/HDL Ratio: 2.7 Ratio
VLDL: 26 mg/dL (ref 0–40)

## 2013-10-21 LAB — CBC WITH DIFFERENTIAL/PLATELET
BASOS ABS: 0 10*3/uL (ref 0.0–0.1)
BASOS PCT: 0 % (ref 0–1)
EOS PCT: 3 % (ref 0–5)
Eosinophils Absolute: 0.2 10*3/uL (ref 0.0–0.7)
HEMATOCRIT: 41.6 % (ref 36.0–46.0)
HEMOGLOBIN: 14.2 g/dL (ref 12.0–15.0)
LYMPHS PCT: 39 % (ref 12–46)
Lymphs Abs: 2.9 10*3/uL (ref 0.7–4.0)
MCH: 29.5 pg (ref 26.0–34.0)
MCHC: 34.1 g/dL (ref 30.0–36.0)
MCV: 86.3 fL (ref 78.0–100.0)
MONOS PCT: 10 % (ref 3–12)
Monocytes Absolute: 0.7 10*3/uL (ref 0.1–1.0)
Neutro Abs: 3.6 10*3/uL (ref 1.7–7.7)
Neutrophils Relative %: 48 % (ref 43–77)
PLATELETS: 274 10*3/uL (ref 150–400)
RBC: 4.82 MIL/uL (ref 3.87–5.11)
RDW: 13.3 % (ref 11.5–15.5)
WBC: 7.4 10*3/uL (ref 4.0–10.5)

## 2013-10-21 LAB — TSH: TSH: 0.73 u[IU]/mL (ref 0.350–4.500)

## 2013-10-21 LAB — VITAMIN D 25 HYDROXY (VIT D DEFICIENCY, FRACTURES): VIT D 25 HYDROXY: 27 ng/mL — AB (ref 30–89)

## 2013-10-21 LAB — FERRITIN: Ferritin: 40 ng/mL (ref 10–291)

## 2013-10-21 LAB — VITAMIN B12: Vitamin B-12: 357 pg/mL (ref 211–911)

## 2013-10-26 ENCOUNTER — Ambulatory Visit (HOSPITAL_BASED_OUTPATIENT_CLINIC_OR_DEPARTMENT_OTHER): Payer: BC Managed Care – PPO

## 2013-10-27 ENCOUNTER — Ambulatory Visit: Payer: BC Managed Care – PPO

## 2013-12-14 ENCOUNTER — Ambulatory Visit (HOSPITAL_BASED_OUTPATIENT_CLINIC_OR_DEPARTMENT_OTHER)
Admission: RE | Admit: 2013-12-14 | Discharge: 2013-12-14 | Disposition: A | Discharge status: Home / Self Care | Payer: BC Managed Care – PPO | Source: Ambulatory Visit | Attending: Family Medicine | Admitting: Family Medicine

## 2013-12-14 DIAGNOSIS — Z1231 Encounter for screening mammogram for malignant neoplasm of breast: Secondary | ICD-10-CM | POA: Insufficient documentation

## 2013-12-14 DIAGNOSIS — Z139 Encounter for screening, unspecified: Secondary | ICD-10-CM

## 2013-12-16 ENCOUNTER — Ambulatory Visit (INDEPENDENT_AMBULATORY_CARE_PROVIDER_SITE_OTHER): Payer: BC Managed Care – PPO | Admitting: Family Medicine

## 2013-12-16 ENCOUNTER — Encounter: Payer: Self-pay | Admitting: Family Medicine

## 2013-12-16 VITALS — BP 123/80 | HR 71 | Temp 98.4°F | Ht 63.25 in | Wt 187.0 lb

## 2013-12-16 DIAGNOSIS — Z0000 Encounter for general adult medical examination without abnormal findings: Secondary | ICD-10-CM

## 2013-12-16 MED ORDER — IBUPROFEN 600 MG PO TABS: 600.0000 mg | ORAL_TABLET | Freq: Three times a day (TID) | ORAL | Status: DC | PRN

## 2013-12-16 NOTE — Patient Instructions (Signed)
We can recheck your vitamin D and iron levels in about 2-3 months. Keep up a regular exercise program and make sure you are eating a healthy diet Try to eat 4 servings of dairy a day, or if you are lactose intolerant take a calcium with vitamin D daily.  Your vaccines are up to date.

## 2013-12-16 NOTE — Progress Notes (Signed)
Subjective:     Brandy Brady is a 43 y.o. female and is here for a comprehensive physical exam. The patient reports problems - her father just passed away about 8 weeks ago.    History   Social History  . Marital Status: Married    Spouse Name: Shon    Number of Children: 4  . Years of Education: B.S.   Occupational History  . customer service     [employer]   Social History Main Topics  . Smoking status: Never Smoker   . Smokeless tobacco: Not on file  . Alcohol Use: 0.5 oz/week    1 drink(s) per week  . Drug Use: No  . Sexual Activity: Yes    Partners: Male    Birth Control/ Protection: Pill   Other Topics Concern  . Not on file   Social History Narrative   She works as a Occupational psychologist for [employer]. She does Zumba 2x a week for 1 hour .Deno Etienne         Health Maintenance  Topic Date Due  . Pap Smear  04/19/2013  . Influenza Vaccine  12/17/2013  . Tetanus/tdap  05/20/2015    The following portions of the patient's history were reviewed and updated as appropriate: allergies, current medications, past family history, past medical history, past social history, past surgical history and problem list.  Review of Systems A comprehensive review of systems was negative.   Objective:    BP 123/80  Pulse 71  Temp(Src) 98.4 F (36.9 C) (Oral)  Ht 5' 3.25" (1.607 m)  Wt 187 lb (84.823 kg)  BMI 32.85 kg/m2 General appearance: alert, cooperative and appears stated age Head: Normocephalic, without obvious abnormality, atraumatic Eyes: conj clear, EOMi, PEERLA Ears: normal TM's and external ear canals both ears Nose: Nares normal. Septum midline. Mucosa normal. No drainage or sinus tenderness. Throat: lips, mucosa, and tongue normal; teeth and gums normal Neck: no adenopathy, no carotid bruit, no JVD, supple, symmetrical, trachea midline and thyroid not enlarged, symmetric, no tenderness/mass/nodules Back: symmetric, no curvature.  ROM normal. No CVA tenderness. Lungs: clear to auscultation bilaterally Heart: regular rate and rhythm, S1, S2 normal, no murmur, click, rub or gallop Abdomen: soft, non-tender; bowel sounds normal; no masses,  no organomegaly Extremities: extremities normal, atraumatic, no cyanosis or edema Pulses: 2+ and symmetric Skin: Skin color, texture, turgor normal. No rashes or lesions Lymph nodes: Cervical, supraclavicular, and axillary nodes normal. Neurologic: Alert and oriented X 3, normal strength and tone. Normal symmetric reflexes. Normal coordination and gait    Assessment:    Healthy female exam.      Plan:     See After Visit Summary for Counseling Recommendations  Keep up a regular exercise program and make sure you are eating a healthy diet Try to eat 4 servings of dairy a day, or if you are lactose intolerant take a calcium with vitamin D daily.  Your vaccines are up to date.   She sees a gyn. Will call to get pap report.   Sees. Dr. Janae Sauce in [facility].   She's also been having some chest discomfort the last couple weeks. She says it just feels like it's hard to get a deep breath in. She notices that after she goes to therapy sessions she actually feels better and feels like her breathing is better. She says her heart hurt for the first couple of weeks after her father passed away. It has been gradually  getting better. She did go to urgent care a few weeks ago and was told that she probably had costochondritis. She's been taking 200 mg of ibuprofen without any significant relief. I really think her chest discomfort is related to her acute depression but we can certainly try and increase strength on ibuprofen to see if it provides any pain relief for her.

## 2014-03-09 ENCOUNTER — Encounter (HOSPITAL_BASED_OUTPATIENT_CLINIC_OR_DEPARTMENT_OTHER): Payer: Self-pay | Admitting: Emergency Medicine

## 2014-03-09 ENCOUNTER — Emergency Department (HOSPITAL_BASED_OUTPATIENT_CLINIC_OR_DEPARTMENT_OTHER)
Admission: EM | Admit: 2014-03-09 | Discharge: 2014-03-09 | Disposition: A | Discharge status: Home / Self Care | Payer: BC Managed Care – PPO | Attending: Emergency Medicine | Admitting: Emergency Medicine

## 2014-03-09 ENCOUNTER — Emergency Department (HOSPITAL_BASED_OUTPATIENT_CLINIC_OR_DEPARTMENT_OTHER): Payer: BC Managed Care – PPO

## 2014-03-09 DIAGNOSIS — S86911A Strain of unspecified muscle(s) and tendon(s) at lower leg level, right leg, initial encounter: Secondary | ICD-10-CM | POA: Diagnosis not present

## 2014-03-09 DIAGNOSIS — Z79899 Other long term (current) drug therapy: Secondary | ICD-10-CM | POA: Insufficient documentation

## 2014-03-09 DIAGNOSIS — Y92322 Soccer field as the place of occurrence of the external cause: Secondary | ICD-10-CM | POA: Insufficient documentation

## 2014-03-09 DIAGNOSIS — R52 Pain, unspecified: Secondary | ICD-10-CM

## 2014-03-09 DIAGNOSIS — S86111A Strain of other muscle(s) and tendon(s) of posterior muscle group at lower leg level, right leg, initial encounter: Secondary | ICD-10-CM

## 2014-03-09 DIAGNOSIS — S8991XA Unspecified injury of right lower leg, initial encounter: Secondary | ICD-10-CM | POA: Diagnosis present

## 2014-03-09 DIAGNOSIS — Y9366 Activity, soccer: Secondary | ICD-10-CM | POA: Insufficient documentation

## 2014-03-09 DIAGNOSIS — X58XXXA Exposure to other specified factors, initial encounter: Secondary | ICD-10-CM | POA: Insufficient documentation

## 2014-03-09 MED ORDER — IBUPROFEN 800 MG PO TABS: 800.0000 mg | ORAL_TABLET | Freq: Three times a day (TID) | ORAL | Status: DC

## 2014-03-09 MED ORDER — HYDROMORPHONE HCL 1 MG/ML IJ SOLN
1.0000 mg | Freq: Once | INTRAMUSCULAR | Status: AC
  Administered 2014-03-09: 1 mg via INTRAMUSCULAR
  Filled 2014-03-09: qty 1

## 2014-03-09 MED ORDER — IBUPROFEN 800 MG PO TABS
800.0000 mg | ORAL_TABLET | Freq: Once | ORAL | Status: AC
  Administered 2014-03-09: 800 mg via ORAL
  Filled 2014-03-09: qty 1

## 2014-03-09 MED ORDER — OXYCODONE-ACETAMINOPHEN 5-325 MG PO TABS: 2.0000 | ORAL_TABLET | Freq: Four times a day (QID) | ORAL | Status: DC | PRN

## 2014-03-09 MED ORDER — DIAZEPAM 5 MG/ML IJ SOLN
5.0000 mg | Freq: Once | INTRAMUSCULAR | Status: AC
  Administered 2014-03-09: 5 mg via INTRAMUSCULAR
  Filled 2014-03-09: qty 2

## 2014-03-09 NOTE — ED Provider Notes (Signed)
CSN: [id number]     Arrival date & time 03/09/14  2004 History  This chart was scribed for Brandy Canalavid H Terrah Decoster, MD by Roxy Cedarhandni Bhalodia, ED Scribe. This patient was seen in room MH12/MH12 and the patient's care was started at 8:20 PM.   Chief Complaint  Patient presents with  . Leg Injury   The history is provided by the patient. No language interpreter was used.    HPI Comments: Brandy Brady is a 43 y.o. female who presents to the Emergency Department complaining of moderate posterior calf pain on right leg. 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 While at practice, patient felt a pop in her right posterior calf and heard a "tearing noise similar to tearing a tshirt" in leg. Patient denies any previous injury to right leg. Patient denies fall.    History reviewed. No pertinent past medical history. Past Surgical History  Procedure Laterality Date  . Cholecystectomy     Family History  Problem Relation Age of Onset  . Cancer - Other Father   . Cancer - Lung Mother   . Cancer - Other    . Diabetes Father    History  Substance Use Topics  . Smoking status: Never Smoker   . Smokeless tobacco: Not on file  . Alcohol Use: 0.5 oz/week    1 drink(s) per week   OB History   Grav Para Term Preterm Abortions TAB SAB Ect Mult Living   6    2  2   4      Review of Systems  Musculoskeletal: Positive for myalgias. Negative for back pain.  All other systems reviewed and are negative.  Allergies  Review of patient's allergies indicates no known allergies.  Home Medications   Prior to Admission medications   Medication Sig Start Date End Date Taking? Authorizing Provider  ibuprofen (ADVIL,MOTRIN) 600 MG tablet Take 1 tablet (600 mg total) by mouth every 8 (eight) hours as needed. 12/16/13   Agapito Gamesatherine D Metheney, MD  Norethin Ace-Eth Estrad-FE (MINASTRIN 24 FE) 1-20 MG-MCG(24) CHEW Chew 1 tablet by mouth daily. 09/09/13   Agapito Gamesatherine D Metheney, MD    Triage Vitals: BP 128/91  Pulse 72  Temp(Src) 98.2 F (36.8 C) (Oral)  Resp 18  Ht 5\' 4"  (1.626 m)  Wt 185 lb (83.915 kg)  BMI 31.74 kg/m2  SpO2 100%  Physical Exam  Nursing note and vitals reviewed. Constitutional: She is oriented to person, place, and time. She appears well-developed and well-nourished. No distress.  HENT:  Head: Normocephalic and atraumatic.  Eyes: Conjunctivae and EOM are normal.  Neck: Neck supple. No tracheal deviation present.  Cardiovascular: Normal rate, regular rhythm and normal heart sounds.   Pulmonary/Chest: Effort normal. No respiratory distress.  Musculoskeletal: Normal range of motion.  No anterior tibial tenderness. Tenderness on distal aspect of gastrocnemius muscle. No tenderness on the achilles tendon. Thompson test abnormal.   Neurological: She is alert and oriented to person, place, and time.  Skin: Skin is warm and dry.  Psychiatric: She has a normal mood and affect. Her behavior is normal.   ED Course  Procedures (including critical care time)  DIAGNOSTIC STUDIES: Oxygen Saturation is 100% on RA, normal by my interpretation.    COORDINATION OF CARE: 8:28 PM- Will give patient dilaudid and valium injection and apply ice for pain management. Discussed plans to order diagnostic imaging of right tibia/fibula. Pt advised of plan for treatment and  pt agrees.  10:03 PM- Discussed normal imaging results and informed patient that she does not have a fracture. Will discharge patient.  Labs Review Labs Reviewed - No data to display  Imaging Review Dg Tibia/fibula Right  03/09/2014   CLINICAL DATA:  43 year old female with abrupt onset tearing sensation and pain at the back of the right lower leg during exertion. Initial encounter.  EXAM: RIGHT TIBIA AND FIBULA - 2 VIEW  COMPARISON:  None.  FINDINGS: Bone mineralization is within normal limits. Normal alignment at the right knee. Proximal tibia and fibula intact. Mortise joint alignment  preserved. Distal tibia and fibula intact. Calcaneus partially visible but appears intact. No definite soft tissue stranding along the distal aspect of the Achilles.  IMPRESSION: No acute osseous abnormality identified in the right tib-fib.   Electronically Signed   By: Augusto GambleLee  Hall M.D.   On: 03/09/2014 21:47     EKG Interpretation None     MDM   Final diagnoses:  Pain   Brandy Brady is a 43 y.o. female here with R leg pain during sports. Likely gastrocnemius tear. However thompson test abnormal which makes me concerned for possible achilles tendon rupture. Posterior splint applied. Given crutches and pain meds. Will have her f/u with ortho   I personally performed the services described in this documentation, which was scribed in my presence. The recorded information has been reviewed and is accurate.    Brandy Canalavid H Rodriques Badie, MD 03/09/14 2207

## 2014-03-09 NOTE — ED Notes (Signed)
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 Unable to apply weight to the leg.

## 2014-03-09 NOTE — Discharge Instructions (Signed)
Take motrin 800 mg every 6 hrs for pain.   Take percocet for severe pain. Do NOT drive with it.   No sports until you feel better  Use splint and crutches.   Follow up with orthopedic doctor if you still have pain in a week.   Return to ER if you have severe pain, unable to walk.

## 2014-03-20 ENCOUNTER — Encounter (HOSPITAL_BASED_OUTPATIENT_CLINIC_OR_DEPARTMENT_OTHER): Payer: Self-pay | Admitting: Emergency Medicine

## 2015-01-18 HISTORY — PX: TUBAL LIGATION: SHX77

## 2015-04-30 ENCOUNTER — Encounter: Payer: Self-pay | Admitting: Family Medicine

## 2015-04-30 ENCOUNTER — Ambulatory Visit (INDEPENDENT_AMBULATORY_CARE_PROVIDER_SITE_OTHER): Payer: BLUE CROSS/BLUE SHIELD | Admitting: Family Medicine

## 2015-04-30 VITALS — BP 129/83 | HR 79 | Temp 98.0°F | Wt 205.0 lb

## 2015-04-30 DIAGNOSIS — Z1231 Encounter for screening mammogram for malignant neoplasm of breast: Secondary | ICD-10-CM

## 2015-04-30 DIAGNOSIS — Z0000 Encounter for general adult medical examination without abnormal findings: Secondary | ICD-10-CM

## 2015-04-30 LAB — CBC
HEMATOCRIT: 39.6 % (ref 36.0–46.0)
Hemoglobin: 13.3 g/dL (ref 12.0–15.0)
MCH: 28.7 pg (ref 26.0–34.0)
MCHC: 33.6 g/dL (ref 30.0–36.0)
MCV: 85.3 fL (ref 78.0–100.0)
MPV: 10.1 fL (ref 8.6–12.4)
PLATELETS: 300 10*3/uL (ref 150–400)
RBC: 4.64 MIL/uL (ref 3.87–5.11)
RDW: 14.2 % (ref 11.5–15.5)
WBC: 8 10*3/uL (ref 4.0–10.5)

## 2015-04-30 LAB — COMPLETE METABOLIC PANEL WITH GFR
ALT: 10 U/L (ref 6–29)
AST: 14 U/L (ref 10–30)
Albumin: 3.9 g/dL (ref 3.6–5.1)
Alkaline Phosphatase: 86 U/L (ref 33–115)
BUN: 13 mg/dL (ref 7–25)
CALCIUM: 9.2 mg/dL (ref 8.6–10.2)
CO2: 25 mmol/L (ref 20–31)
CREATININE: 0.59 mg/dL (ref 0.50–1.10)
Chloride: 105 mmol/L (ref 98–110)
GFR, Est African American: >89 mL/min (ref >=60)
GFR, Est Non African American: >89 mL/min (ref >=60)
GLUCOSE: 76 mg/dL (ref 65–99)
Potassium: 4.7 mmol/L (ref 3.5–5.3)
Sodium: 138 mmol/L (ref 135–146)
TOTAL PROTEIN: 7.1 g/dL (ref 6.1–8.1)
Total Bilirubin: 0.4 mg/dL (ref 0.2–1.2)

## 2015-04-30 LAB — LIPID PANEL
CHOLESTEROL: 174 mg/dL (ref 125–200)
HDL: 59 mg/dL (ref >=46)
LDL Cholesterol: 98 mg/dL (ref <130)
Total CHOL/HDL Ratio: 2.9 Ratio (ref <=5.0)
Triglycerides: 85 mg/dL (ref <150)
VLDL: 17 mg/dL (ref <30)

## 2015-04-30 NOTE — Progress Notes (Signed)
  Subjective:     Brandy Brady is a 44 y.o. female and is here for a comprehensive physical exam. The patient reports no problems. Had a new baby in September and is doing well. Had a csec and tubal ligation.    Social History   Social History  . Marital Status: Married    Spouse Name: Shon  . Number of Children: 4  . Years of Education: B.S.   Occupational History  . customer service     [employer]   Social History Main Topics  . Smoking status: Never Smoker   . Smokeless tobacco: Not on file  . Alcohol Use: 0.5 oz/week    1 drink(s) per week  . Drug Use: No  . Sexual Activity:    Partners: Male    Birth Control/ Protection: Pill   Other Topics Concern  . 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. She does Zumba 2x a week for 1 hour .[name]         Health Maintenance  Topic Date Due  . HIV Screening  03/19/1987  . PAP SMEAR  04/19/2013  . INFLUENZA VACCINE  12/18/2014  . TETANUS/TDAP  05/20/2015    The following portions of the patient's history were reviewed and updated as appropriate: allergies, current medications, past family history, past medical history, past social history, past surgical history and problem list.  Review of Systems Pertinent items noted in HPI and remainder of comprehensive ROS otherwise negative.   Objective:    BP 129/83 mmHg  Pulse 79  Temp(Src) 98 F (36.7 C) (Oral)  Wt 205 lb (92.987 kg)  SpO2 100% General appearance: alert, cooperative and appears stated age Head: Normocephalic, without obvious abnormality, atraumatic Eyes: conj clear, EOMI, PEERLA Ears: normal TM's and external ear canals both ears Nose: Nares normal. Septum midline. Mucosa normal. No drainage or sinus tenderness. Throat: lips, mucosa, and tongue normal; teeth and gums normal Neck: no adenopathy, no carotid bruit, no JVD, supple, symmetrical, trachea midline and thyroid not  enlarged, symmetric, no tenderness/mass/nodules Back: symmetric, no curvature. ROM normal. No CVA tenderness. Lungs: clear to auscultation bilaterally Heart: regular rate and rhythm, S1, S2 normal, no murmur, click, rub or gallop Abdomen: soft, non-tender; bowel sounds normal; no masses,  no organomegaly Extremities: extremities normal, atraumatic, no cyanosis or edema Pulses: 2+ and symmetric Skin: Skin color, texture, turgor normal. No rashes or lesions Lymph nodes: Cervical, supraclavicular, and axillary nodes normal. Neurologic: Alert and oriented X 3, normal strength and tone. Normal symmetric reflexes. Normal coordination and gait    Assessment:    Healthy female exam.    Plan:     See After Visit Summary for Counseling Recommendations  Keep up a regular exercise program and make sure you are eating a healthy diet Try to eat 4 servings of dairy a day, or if you are lactose intolerant take a calcium with vitamin D daily.  Your vaccines are up to date.

## 2015-04-30 NOTE — Patient Instructions (Signed)
Keep up a regular exercise program and make sure you are eating a healthy diet Try to eat 4 servings of dairy a day, or if you are lactose intolerant take a calcium with vitamin D daily.  Your vaccines are up to date.   

## 2015-05-01 ENCOUNTER — Ambulatory Visit (HOSPITAL_BASED_OUTPATIENT_CLINIC_OR_DEPARTMENT_OTHER)
Admission: RE | Admit: 2015-05-01 | Discharge: 2015-05-01 | Disposition: A | Discharge status: Home / Self Care | Payer: BLUE CROSS/BLUE SHIELD | Source: Ambulatory Visit | Attending: Family Medicine | Admitting: Family Medicine

## 2015-05-01 DIAGNOSIS — Z1231 Encounter for screening mammogram for malignant neoplasm of breast: Secondary | ICD-10-CM | POA: Diagnosis present

## 2015-05-01 NOTE — Progress Notes (Signed)
Quick Note:  All labs are normal. ______ 

## 2015-05-16 ENCOUNTER — Emergency Department (HOSPITAL_BASED_OUTPATIENT_CLINIC_OR_DEPARTMENT_OTHER): Payer: BLUE CROSS/BLUE SHIELD

## 2015-05-16 ENCOUNTER — Emergency Department (HOSPITAL_COMMUNITY): Payer: BLUE CROSS/BLUE SHIELD

## 2015-05-16 ENCOUNTER — Encounter (HOSPITAL_BASED_OUTPATIENT_CLINIC_OR_DEPARTMENT_OTHER): Payer: Self-pay | Admitting: *Deleted

## 2015-05-16 ENCOUNTER — Inpatient Hospital Stay (HOSPITAL_COMMUNITY): Payer: BLUE CROSS/BLUE SHIELD

## 2015-05-16 ENCOUNTER — Inpatient Hospital Stay (HOSPITAL_BASED_OUTPATIENT_CLINIC_OR_DEPARTMENT_OTHER)
Admission: EM | Admit: 2015-05-16 | Discharge: 2015-05-18 | DRG: 065 | Disposition: A | Discharge status: Home / Self Care | Payer: BLUE CROSS/BLUE SHIELD | Attending: Internal Medicine | Admitting: Internal Medicine

## 2015-05-16 DIAGNOSIS — O9089 Other complications of the puerperium, not elsewhere classified: Secondary | ICD-10-CM | POA: Diagnosis not present

## 2015-05-16 DIAGNOSIS — G43109 Migraine with aura, not intractable, without status migrainosus: Secondary | ICD-10-CM | POA: Diagnosis present

## 2015-05-16 DIAGNOSIS — G8191 Hemiplegia, unspecified affecting right dominant side: Secondary | ICD-10-CM | POA: Diagnosis present

## 2015-05-16 DIAGNOSIS — I639 Cerebral infarction, unspecified: Principal | ICD-10-CM

## 2015-05-16 DIAGNOSIS — R200 Anesthesia of skin: Secondary | ICD-10-CM | POA: Diagnosis present

## 2015-05-16 DIAGNOSIS — K219 Gastro-esophageal reflux disease without esophagitis: Secondary | ICD-10-CM | POA: Diagnosis present

## 2015-05-16 DIAGNOSIS — E785 Hyperlipidemia, unspecified: Secondary | ICD-10-CM | POA: Diagnosis present

## 2015-05-16 DIAGNOSIS — E669 Obesity, unspecified: Secondary | ICD-10-CM | POA: Diagnosis present

## 2015-05-16 DIAGNOSIS — R29898 Other symptoms and signs involving the musculoskeletal system: Secondary | ICD-10-CM

## 2015-05-16 DIAGNOSIS — I638 Other cerebral infarction: Secondary | ICD-10-CM | POA: Diagnosis not present

## 2015-05-16 DIAGNOSIS — I6389 Other cerebral infarction: Secondary | ICD-10-CM

## 2015-05-16 DIAGNOSIS — R531 Weakness: Secondary | ICD-10-CM | POA: Diagnosis present

## 2015-05-16 DIAGNOSIS — Z6835 Body mass index (BMI) 35.0-35.9, adult: Secondary | ICD-10-CM | POA: Diagnosis not present

## 2015-05-16 HISTORY — DX: Obesity, unspecified: E66.9

## 2015-05-16 HISTORY — DX: Migraine, unspecified, not intractable, without status migrainosus: G43.909

## 2015-05-16 HISTORY — DX: Cerebral infarction, unspecified: I63.9

## 2015-05-16 HISTORY — DX: Gastro-esophageal reflux disease without esophagitis: K21.9

## 2015-05-16 LAB — URINALYSIS, ROUTINE W REFLEX MICROSCOPIC
BILIRUBIN URINE: NEGATIVE
Glucose, UA: NEGATIVE mg/dL
KETONES UR: NEGATIVE mg/dL
Leukocytes, UA: NEGATIVE
Nitrite: NEGATIVE
Protein, ur: NEGATIVE mg/dL
Specific Gravity, Urine: 1.014 (ref 1.005–1.030)
pH: 5 (ref 5.0–8.0)

## 2015-05-16 LAB — BASIC METABOLIC PANEL
ANION GAP: 7 (ref 5–15)
BUN: 18 mg/dL (ref 6–20)
CO2: 23 mmol/L (ref 22–32)
CREATININE: 0.64 mg/dL (ref 0.44–1.00)
Calcium: 9 mg/dL (ref 8.9–10.3)
Chloride: 108 mmol/L (ref 101–111)
GFR calc non Af Amer: >60 mL/min (ref >60)
Glucose, Bld: 96 mg/dL (ref 65–99)
Potassium: 3.7 mmol/L (ref 3.5–5.1)
SODIUM: 138 mmol/L (ref 135–145)

## 2015-05-16 LAB — PROTIME-INR
INR: 0.93 (ref 0.00–1.49)
Prothrombin Time: 12.7 seconds (ref 11.6–15.2)

## 2015-05-16 LAB — URINE MICROSCOPIC-ADD ON

## 2015-05-16 LAB — CBC
HCT: 43.2 % (ref 36.0–46.0)
Hemoglobin: 13.9 g/dL (ref 12.0–15.0)
MCH: 28 pg (ref 26.0–34.0)
MCHC: 32.2 g/dL (ref 30.0–36.0)
MCV: 87.1 fL (ref 78.0–100.0)
Platelets: 276 10*3/uL (ref 150–400)
RBC: 4.96 MIL/uL (ref 3.87–5.11)
RDW: 14 % (ref 11.5–15.5)
WBC: 6.9 10*3/uL (ref 4.0–10.5)

## 2015-05-16 LAB — APTT: aPTT: 32 seconds (ref 24–37)

## 2015-05-16 LAB — ANTITHROMBIN III: ANTITHROMB III FUNC: 97 % (ref 75–120)

## 2015-05-16 LAB — C-REACTIVE PROTEIN

## 2015-05-16 MED ORDER — HEPARIN SODIUM (PORCINE) 5000 UNIT/ML IJ SOLN
5000.0000 [IU] | Freq: Three times a day (TID) | INTRAMUSCULAR | Status: DC
  Administered 2015-05-16 – 2015-05-18 (×5): 5000 [IU] via SUBCUTANEOUS
  Filled 2015-05-16 (×5): qty 1

## 2015-05-16 MED ORDER — ACETAMINOPHEN 325 MG PO TABS
650.0000 mg | ORAL_TABLET | Freq: Four times a day (QID) | ORAL | Status: DC | PRN
  Administered 2015-05-16 – 2015-05-17 (×2): 650 mg via ORAL
  Filled 2015-05-16 (×2): qty 2

## 2015-05-16 MED ORDER — ASPIRIN 300 MG RE SUPP
300.0000 mg | Freq: Every day | RECTAL | Status: DC
Start: 2015-05-16 — End: 2015-05-18

## 2015-05-16 MED ORDER — PANTOPRAZOLE SODIUM 40 MG PO TBEC
40.0000 mg | DELAYED_RELEASE_TABLET | Freq: Every day | ORAL | Status: DC
  Administered 2015-05-17: 40 mg via ORAL
  Filled 2015-05-16: qty 1

## 2015-05-16 MED ORDER — ASPIRIN 81 MG PO CHEW
324.0000 mg | CHEWABLE_TABLET | Freq: Once | ORAL | Status: AC
  Administered 2015-05-16: 324 mg via ORAL
  Filled 2015-05-16: qty 4

## 2015-05-16 MED ORDER — IOHEXOL 350 MG/ML SOLN
80.0000 mL | Freq: Once | INTRAVENOUS | Status: AC | PRN
  Administered 2015-05-16: 80 mL via INTRAVENOUS

## 2015-05-16 MED ORDER — SENNOSIDES-DOCUSATE SODIUM 8.6-50 MG PO TABS: 1.0000 | ORAL_TABLET | Freq: Every evening | ORAL | Status: DC | PRN

## 2015-05-16 MED ORDER — STROKE: EARLY STAGES OF RECOVERY BOOK
Freq: Once | Status: AC
  Administered 2015-05-16: 23:00:00
  Filled 2015-05-16: qty 1

## 2015-05-16 MED ORDER — ASPIRIN 325 MG PO TABS
325.0000 mg | ORAL_TABLET | Freq: Every day | ORAL | Status: DC
  Administered 2015-05-17 – 2015-05-18 (×2): 325 mg via ORAL
  Filled 2015-05-16 (×2): qty 1

## 2015-05-16 NOTE — ED Notes (Signed)
Pt assisted to the bathroom, able to move right leg sideways, unable to move leg forward, had to lift right leg physically. Taken to br in w/c, transferred without from bed to w/c and w/c to commode.

## 2015-05-16 NOTE — ED Notes (Signed)
[facility]

## 2015-05-16 NOTE — ED Notes (Signed)
Neurologist at bedside. 

## 2015-05-16 NOTE — ED Notes (Signed)
Patient states she developed right leg numbness after driving in a car for a short distance.  States when she got out of the car her leg felt heavy and she was unable to lift it to step up the steps.  States she drove herself here and while driving, the numbness worsened.  States she can walk, but can not lift her leg up.

## 2015-05-16 NOTE — ED Notes (Signed)
MD at bedside. 

## 2015-05-16 NOTE — ED Provider Notes (Signed)
CSN: [id number]     Arrival date & time 05/16/15  1205 History   First MD Initiated Contact with Patient 05/16/15 1315     Chief Complaint  Patient presents with  . Extremity Weakness     (Consider location/radiation/quality/duration/timing/severity/associated sxs/prior Treatment) HPI  Pt presenting with c/o right lower extremity weakness.  Pt states her leg started to feel numb after she was driving today- when she got out of the car she felt her leg was shaky underneath her and "not under my control", but she was able to ambulate.  No headche, no changes in vision or speech.  No hx of seizure.  No back pain or difficulty urinating.  Her leg is not painful.  Upon arrival to the ED per nursing notes she ambulated without difficulty and moved her leg without difficulty in triage.  No swelling of lower extremity.  There are no other associated systemic symptoms, there are no other alleviating or modifying factors.   History reviewed. No pertinent past medical history. Past Surgical History  Procedure Laterality Date  . Cholecystectomy    . Tubal ligation  01/2015  . Cesarean section  01/19/2015   Family History  Problem Relation Age of Onset  . Cancer - Other Father   . Cancer - Lung Mother   . Cancer - Other    . Diabetes Father    Social History  Substance Use Topics  . Smoking status: Never Smoker   . Smokeless tobacco: None  . Alcohol Use: 0.5 oz/week    1 Standard drinks or equivalent per week   OB History    Gravida Para Term Preterm AB TAB SAB Ectopic Multiple Living   Review of Systems  ROS reviewed and all otherwise negative except for mentioned in HPI    Allergies  Review of patient's allergies indicates no known allergies.  Home Medications   Prior to Admission medications   Not on File   BP 117/84 mmHg  Pulse 74  Resp 18  SpO2 99%  LMP 05/14/2015 (Exact Date)  Vitals reviewed Physical Exam  Physical Examination: General appearance  - alert, well appearing, and in no distress Mental status - alert, oriented to person, place, and time Eyes - pupils equal and reactive, extraocular eye movements intact Chest - clear to auscultation, no wheezes, rales or rhonchi, symmetric air entry Heart - normal rate, regular rhythm, normal S1, S2, no murmurs, rubs, clicks or gallops Abdomen - soft, nontender, nondistended, no masses or organomegaly Neurological - alert, oriented x 3, cranial nerves 2-12 tested and intact,  Right lower extremity with 2+ DTRs, normal tone, pt resisting with full strength against attempts at passive ROM but states she is not able to flex or extend her leg at the knee, states not able to lift right lower extremity off bed, able to plantar and dorsiflex foot without difficulty, , sensation intact to light touch, pt able to bear weight with ambulation but is limping somewhat Extremities - peripheral pulses normal, no pedal edema, no clubbing or cyanosis Skin - normal coloration and turgor, no rashes  ED Course  Procedures (including critical care time) Labs Review Labs Reviewed  CBC  BASIC METABOLIC PANEL  PROTIME-INR  APTT  URINALYSIS, ROUTINE W REFLEX MICROSCOPIC (NOT AT [facility])    Imaging Review Ct Head Wo Contrast  05/16/2015  CLINICAL DATA:  RIGHT leg weakness. This occurred after driving a car for short  distance. EXAM: CT HEAD WITHOUT CONTRAST TECHNIQUE: Contiguous axial images were obtained from the base of the skull through the vertex without intravenous contrast. COMPARISON:  None. FINDINGS: No evidence for acute infarction, hemorrhage, mass lesion, hydrocephalus, or extra-axial fluid. No atrophy or white matter disease. Intact calvarium. Negative orbits. Incompletely visualized LEFT maxillary sinus disease. IMPRESSION: No acute intracranial findings. No visible acute stroke, hemorrhage, or signs of large vessel occlusion. Cannot exclude acute LEFT maxillary sinus disease. Electronically Signed   By:  Elsie StainJohn T Curnes M.D.   On: 05/16/2015 14:17   I have personally reviewed and evaluated these images and lab results as part of my medical decision-making.   EKG Interpretation   Date/Time:  Wednesday May 16 2015 14:13:20 EST Ventricular Rate:  61 PR Interval:  132 QRS Duration: 100 QT Interval:  424 QTC Calculation: 427 R Axis:   79 Text Interpretation:  Sinus rhythm Consider left atrial enlargement No old  tracing to compare Confirmed by [facility]  MD, Makylie Rivere [phone]([id number]) on 05/16/2015  2:32:46 PM      MDM   Final diagnoses:  Weakness of right lower extremity    Pt presenting with c/o right lower extremity weakness.  Initially on arrival to the ED her symptoms were mild, on my exam she had full strength in right lower extremity, except only difficulty with hip flexor- not able to lift leg off bed.  After ambulation on my exam patient states the symptoms improved further.  No upper extremity weakness or cranial nerve abnormality.  Labs, Head CT obtained.    2:37 PM pt initially walked to room with no apparent weakness per nursing.  Now on my re-evaluation she states she had been feeling better and weakness resolved, but now she is currently weak again in her right lower extremity.  She has normal reflexes, can be felt resisting upon passive ROM.  Unclear etiology.  Am paging neurology now.   2:47 PM d/w Dr. Roseanne RenoStewart, he [facility].  Not a code stroke patient based on neurologic exam.  Pt is agreeable with plan for transfer.    2:53 PM  D/w Dr. Juleen ChinaKohut[facility], he accepts patient for transfer.    Jerelyn ScottMartha Linker, MD 05/17/15 [phone]

## 2015-05-16 NOTE — Consult Note (Signed)
Neurology Consultation Reason for Consult: Right leg weakness Referring Physician: Christy Gentles, D  CC: Stroke  History is obtained from: Patient, husband  HPI: Brandy Brady is a 44 y.o. female with no significant past medical history other than recent childbirth 16 weeks ago who presents with small infarct causing right-sided weakness. She states that initially it was less severe, but came and went at least 2 times but now since she has been met [facility] and is persistent.  She did notice that she had some posterior neck pain that started yesterday and she thought she had "slept on her neck wrong." She denies any other pain.  LKW: noon tpa given?: no, initially symptoms had improved, but never went back to completely normal, then worsened again.     ROS: A 14 point ROS was performed and is negative except as noted in the HPI.   Past Medical History  Diagnosis Date  . GERD (gastroesophageal reflux disease)   . Obesity   . Migraine      Family History  Problem Relation Age of Onset  . Cancer - Other Father   . Cancer - Lung Mother   . Cancer - Other    . Diabetes Father      Social History:  reports that she has never smoked. She does not have any smokeless tobacco history on file. She reports that she drinks about 0.5 oz of alcohol per week. She reports that she does not use illicit drugs.   Exam: Current vital signs: BP 115/74 mmHg  Pulse 68  Temp(Src) 98.2 F (36.8 C) (Oral)  Resp 15  SpO2 99%  LMP 05/14/2015 (Exact Date) Vital signs in last 24 hours: Temp:  [98.2 F (36.8 C)-98.7 F (37.1 C)] 98.2 F (36.8 C) (12/28 2003) Pulse Rate:  [59-91] 68 (12/28 2100) Resp:  [15-29] 15 (12/28 2100) BP: (108-135)/(74-94) 115/74 mmHg (12/28 2100) SpO2:  [99 %-100 %] 99 % (12/28 2100)   Physical Exam  Constitutional: Appears well-developed and well-nourished.  Psych: Affect appropriate to situation Eyes: No scleral injection HENT: No OP obstrucion Head:  Normocephalic.  Cardiovascular: Normal rate and regular rhythm.  Respiratory: Effort normal and breath sounds normal to anterior ascultation GI: Soft.  No distension. There is no tenderness.  Skin: WDI  Neuro: Mental Status: Patient is awake, alert, oriented to person, place, month, year, and situation. Patient is able to give a clear and coherent history. No signs of aphasia or neglect Cranial Nerves: II: Visual Fields are full. Pupils are equal, round, and reactive to light.   III,IV, VI: EOMI without ptosis or diploplia.  V: Facial sensation is symmetric to temperature VII: Facial movement is symmetric.  VIII: hearing is intact to voice X: Uvula elevates symmetrically XI: Shoulder shrug is symmetric. XII: tongue is midline without atrophy or fasciculations.  Motor: Tone is normal. Bulk is normal. 5/5 strength was present on the left, on the right she has a very mild 4+/5 weakness in the right arm with mild drift, 4 minus/5 in the right leg Sensory: Sensation is diminished to pinprick in the right leg Cerebellar: FNF intact on the left, consistent with weakness on the right         I have reviewed labs in epic and the results pertinent to this consultation are: BMP, CBC-unremarkable  I have reviewed the images obtained: MRI brain-small infarct juxtacortical on the right  Impression: 44 year old female with small infarct of unclear etiology. She will need a workup to determine etiology.  She'll also need or physical therapy.  Recommendations: 1. HgbA1c, fasting lipid panel 2. CT angiogram of the head and neck  3. Frequent neuro checks 4. Echocardiogram 5. Carotid dopplers are not needed given CTA 6. Prophylactic therapy-Antiplatelet med: Aspirin - dose 339m PO or 3025mPR 7. Risk factor modification 8. Telemetry monitoring 9. PT consult, OT consult, Speech consult 10. If above studies do not reveal an etiology, then would proceed with hypercoagulable  testing   McRoland RackMD [facility] [phone] 7pm- 7am, please page neurology [place] call [place]

## 2015-05-16 NOTE — ED Notes (Signed)
Patient transported to CT 

## 2015-05-16 NOTE — ED Notes (Addendum)
Pt states "I can't move my leg." pt noted moving her leg, pt states "I can't move it up like this (lifts leg off of stretcher) or this (bends knee and pulls leg upward at the hip). Pt reports she went for a 10 minute drive, and then noticed she could not move her leg these ways. Pt demonstrates the movements that she states she cannot perform with good strength and flexibility. Pt states "it also feels a little numb sometimes". Pt states "I can't walk either..." pt was observed amb to room with quick steady gait with Raynelle FanningJulie, triage nurse.

## 2015-05-16 NOTE — ED Provider Notes (Signed)
MRI abnormal, shows stroke D/w dr Amada Jupiterkirkpatrick with neuro D/w dr Clyde Lundborgniu, will admit  tPA in stroke considered but not given due to: Onset over 3-4.5hours    Brandy Rhineonald Detrell Umscheid, MD 05/16/15 2020

## 2015-05-16 NOTE — ED Notes (Signed)
Patient transported to xray department via stretcher.

## 2015-05-16 NOTE — ED Notes (Signed)
Attempted to call report

## 2015-05-16 NOTE — ED Notes (Signed)
[facility] -- to have an MRI .

## 2015-05-16 NOTE — ED Provider Notes (Signed)
[facility] Pt with intermittent right LE weakness No back pain reported No abd pain Concern was for CVA She has have weakness with flexion of right hip.  Distal pulses intact She denies numbness I spoke to dr Roseanne Renostewart who is aware of case He recommends MR brain without contrast   Brandy Rhineonald Jet Traynham, MD 05/16/15 1708

## 2015-05-16 NOTE — H&P (Signed)
[facility] History and Physical  Brandy Brady WVP:[id number] DOB: [birth year] DOA: 05/16/2015  Referring physician: ED physician PCP: Beatrice Lecher, MD  Specialists:   Chief Complaint: Right leg weakness   HPI: Brandy Brady is a 44 y.o. female with PMH of migraine headache, obesity, GERD, who presents with right leg weakness.  Pt states her leg started to feel numb after she was driving at noon today. When she got out of the car, she felt her leg was heavy. She also had right arm numbness which has resolved. Patient does not have vision change, hearing loss, urinary incontinence or seizure activity. Patient does not have abdominal pain, diarrhea, symptoms of UTI or rashes. She has mild headache.  In ED, patient was found to have negative urinalysis, INR 0.93, PTT 32, WBC 6.9, temperature normal, slightly bradycardia, electrolytes and renal function okay. Negative CT head for acute abnormalities. MRI-brain showed Punctate acute lacunar infarct in the subcortical white matter of the posterior left frontal lobe near the peri-Rolandic cortex, no associated hemorrhage or mass effect, left maxillary sinus inflammation.  Where does patient live?   At home    Can patient participate in ADLs?  Yes     Review of Systems:   General: no fevers, chills, no changes in body weight, has fatigue HEENT: no blurry vision, hearing changes or sore throat Pulm: no dyspnea, coughing, wheezing CV: no chest pain, palpitations Abd: no nausea, vomiting, abdominal pain, diarrhea, constipation GU: no dysuria, burning on urination, increased urinary frequency, hematuria  Ext: no leg edema Neuro: has R leg weakness and R arm numbness. No vision change or hearing loss Skin: no rash MSK: No muscle spasm, no deformity, no limitation of range of movement in spin Heme: No easy bruising.  Travel history: No recent long distant travel.  Allergy: No Known Allergies  Past Medical History  Diagnosis  Date  . GERD (gastroesophageal reflux disease)   . Obesity   . Migraine     Past Surgical History  Procedure Laterality Date  . Cholecystectomy    . Tubal ligation  01/2015  . Cesarean section  01/19/2015    Social History:  reports that she has never smoked. She does not have any smokeless tobacco history on file. She reports that she drinks about 0.5 oz of alcohol per week. She reports that she does not use illicit drugs.  Family History:  Family History  Problem Relation Age of Onset  . Cancer - Other Father   . Cancer - Lung Mother   . Cancer - Other    . Diabetes Father      Prior to Admission medications   Not on File    Physical Exam: Filed Vitals:   05/16/15 2012 05/16/15 2013 05/16/15 2014 05/16/15 2015  BP:      Pulse: 71 72 75 74  Temp:      TempSrc:      Resp: _0 SpO2: 99% 100% 100% 100%   General: Not in acute distress HEENT:       Eyes: PERRL, EOMI, no scleral icterus.       ENT: No discharge from the ears and nose, no pharynx injection, no tonsillar enlargement.        Neck: No JVD, no bruit, no mass felt. Heme: No neck lymph node enlargement. Cardiac: S1/S2, RRR, No murmurs, No gallops or rubs. Pulm:  No rales, wheezing, rhonchi or rubs. Abd: Soft, nondistended, nontender, no rebound pain, no organomegaly, BS present.  Ext: No pitting leg edema bilaterally. 2+DP/PT pulse bilaterally. Musculoskeletal: No joint deformities, No joint redness or warmth, no limitation of ROM in spin. Skin: No rashes.  Neuro: Alert, oriented X3, cranial nerves II-XII grossly intact, muscle strength 1/5 in R leg and 5/5 in other extremities, sensation to light touch intact decreased in R leg. Knee reflex 1+ bilaterally. Negative Babinski's sign. Normal finger to nose test. Psych: Patient is not psychotic, no suicidal or hemocidal ideation.  Labs on Admission:  Basic Metabolic Panel:  Recent Labs Lab 05/16/15 1349  NA 138  K 3.7  CL 108  CO2 23  GLUCOSE 96   BUN 18  CREATININE 0.64  CALCIUM 9.0   Liver Function Tests: No results for input(s): AST, ALT, ALKPHOS, BILITOT, PROT, ALBUMIN in the last 168 hours. No results for input(s): LIPASE, AMYLASE in the last 168 hours. No results for input(s): AMMONIA in the last 168 hours. CBC:  Recent Labs Lab 05/16/15 1349  WBC 6.9  HGB 13.9  HCT 43.2  MCV 87.1  PLT 276   Cardiac Enzymes: No results for input(s): CKTOTAL, CKMB, CKMBINDEX, TROPONINI in the last 168 hours.  BNP (last 3 results) No results for input(s): BNP in the last 8760 hours.  ProBNP (last 3 results) No results for input(s): PROBNP in the last 8760 hours.  CBG: No results for input(s): GLUCAP in the last 168 hours.  Radiological Exams on Admission: Ct Head Wo Contrast  05/16/2015  CLINICAL DATA:  RIGHT leg weakness. This occurred after driving a car for short distance. EXAM: CT HEAD WITHOUT CONTRAST TECHNIQUE: Contiguous axial images were obtained from the base of the skull through the vertex without intravenous contrast. COMPARISON:  None. FINDINGS: No evidence for acute infarction, hemorrhage, mass lesion, hydrocephalus, or extra-axial fluid. No atrophy or white matter disease. Intact calvarium. Negative orbits. Incompletely visualized LEFT maxillary sinus disease. IMPRESSION: No acute intracranial findings. No visible acute stroke, hemorrhage, or signs of large vessel occlusion. Cannot exclude acute LEFT maxillary sinus disease. Electronically Signed   By: Staci Righter M.D.   On: 05/16/2015 14:17   Mr Brain Wo Contrast  05/16/2015  CLINICAL DATA:  44 year old female with right lower extremity numbness and weakness of acute onset. Initial encounter. EXAM: MRI HEAD WITHOUT CONTRAST TECHNIQUE: Multiplanar, multiecho pulse sequences of the brain and surrounding structures were obtained without intravenous contrast. COMPARISON:  Head CT without contrast 1351 hours today. FINDINGS: Punctate focus of increased Brandy diffusion in  the left posterior MCA white matter near the peri-Rolandic cortex (series 4, image 41) does appear restricted on the ADC map (series 400, image 40) but is not well correlated on coronal diffusion images. No associated T2 or FLAIR hyperintensity. Major intracranial vascular flow voids are within normal limits. No other restricted diffusion. No midline shift, mass effect, evidence of mass lesion, ventriculomegaly, extra-axial collection or acute intracranial hemorrhage. Cervicomedullary junction and pituitary are within normal limits. Negative visualized cervical spine. Normal gray and white matter signal on non diffusion sequences. No chronic cerebral blood products or encephalomalacia identified. Visible internal auditory structures appear normal. Mastoids are clear. Fluid in bubbly opacity in the left maxillary sinus with mild paranasal sinus mucosal thickening elsewhere. Negative orbit and scalp soft tissues. IMPRESSION: 1. Punctate acute lacunar infarct in the subcortical white matter of the posterior left frontal lobe near the peri-Rolandic cortex (series 4, image 41). No associated hemorrhage or mass effect. 2. Otherwise negative noncontrast MRI appearance of the brain. 3. Left maxillary sinus inflammation. Electronically  Signed   By: Genevie Ann M.D.   On: 05/16/2015 19:23    EKG: Independently reviewed. QTC 427, no ischemic change.   Assessment/Plan Principal Problem:   CVA (cerebral infarction) Active Problems:   OBESITY   GERD  CVA (cerebral infarction): MRI-brain showed Punctate acute lacunar infarct in the subcortical white matter of the posterior left frontal lobe near the peri-Rolandic cortex. Neurology was consulted.  - will admit to tele bed - will f/u neurology's recommendations - Risk factor modification: HgbA1c, TSH, fasting lipid panel  - CTA of neck and head - PT consult, OT consult, Speech consult  - 2 d Echocardiogram  - Ekg  - Aspirin - check UDS, RPR, ESR, HIV antibody,  ANA, and C-reactive protein; hypercoagulable panel  GERD: not on meds at home -start Protonix   DVT ppx: SQ Heparin   Code Status: Full code Family Communication: Yes, patient's  hasband at bed side Disposition Plan: Admit to inpatient   Date of Service 05/16/2015    Ivor Costa [facility] Pager [phone]  If 7PM-7AM, please contact night-coverage [email or web address] Password [email or web address] 05/16/2015, 8:45 PM

## 2015-05-16 NOTE — ED Notes (Signed)
Pt c/o headache-- frontal and left facial area.

## 2015-05-17 ENCOUNTER — Ambulatory Visit (HOSPITAL_COMMUNITY): Payer: BLUE CROSS/BLUE SHIELD

## 2015-05-17 DIAGNOSIS — G43109 Migraine with aura, not intractable, without status migrainosus: Secondary | ICD-10-CM | POA: Insufficient documentation

## 2015-05-17 DIAGNOSIS — E785 Hyperlipidemia, unspecified: Secondary | ICD-10-CM | POA: Insufficient documentation

## 2015-05-17 DIAGNOSIS — E669 Obesity, unspecified: Secondary | ICD-10-CM

## 2015-05-17 DIAGNOSIS — I639 Cerebral infarction, unspecified: Secondary | ICD-10-CM

## 2015-05-17 DIAGNOSIS — O9089 Other complications of the puerperium, not elsewhere classified: Secondary | ICD-10-CM

## 2015-05-17 DIAGNOSIS — I638 Other cerebral infarction: Secondary | ICD-10-CM

## 2015-05-17 LAB — RPR: RPR: NONREACTIVE

## 2015-05-17 LAB — LIPID PANEL
CHOL/HDL RATIO: 3.4 ratio
CHOLESTEROL: 201 mg/dL — AB (ref 0–200)
HDL: 59 mg/dL (ref >40)
LDL Cholesterol: 123 mg/dL — ABNORMAL HIGH (ref 0–99)
TRIGLYCERIDES: 94 mg/dL (ref <150)
VLDL: 19 mg/dL (ref 0–40)

## 2015-05-17 LAB — TSH: TSH: 0.759 u[IU]/mL (ref 0.350–4.500)

## 2015-05-17 LAB — HIV ANTIBODY (ROUTINE TESTING W REFLEX): HIV Screen 4th Generation wRfx: NONREACTIVE

## 2015-05-17 LAB — SEDIMENTATION RATE: SED RATE: 12 mm/h (ref 0–22)

## 2015-05-17 MED ORDER — ATORVASTATIN CALCIUM 10 MG PO TABS
20.0000 mg | ORAL_TABLET | Freq: Every day | ORAL | Status: DC
  Administered 2015-05-17: 20 mg via ORAL
  Filled 2015-05-17: qty 2

## 2015-05-17 MED ORDER — BUTALBITAL-APAP-CAFFEINE 50-325-40 MG PO TABS
1.0000 | ORAL_TABLET | Freq: Three times a day (TID) | ORAL | Status: DC | PRN
  Administered 2015-05-17: 1 via ORAL
  Filled 2015-05-17: qty 1

## 2015-05-17 NOTE — Progress Notes (Signed)
PATIENT DETAILS Name: Brandy Brady Age: 44 y.o. Sex: female Date of Birth: [birth year] Admit Date: 05/16/2015 Admitting Physician Lorretta HarpXilin Niu, MD ZOX:[id number],[id number]:METHENEY,CATHERINE, MD  Subjective: Continues to have significant right leg weakness.  Assessment/Plan: Principal Problem:  Acute CVA (cerebral infarction): MRI brain shows a small left frontal subcortical infarct, per neurology could have a functional component as clinical findings seem disproportionate to MRI findings. CTA head and neck unremarkable for major stenosis. 2-D echo pending and lower extremity Dopplers pending. Hypercoagulable/autoimmune workup pending as well. LDL elevated at 123 (goal <29<70) .Continue aspirin and statin. Await further recommendations from physical therapy services.  Active Problems: OBESITY: Counseled regarding importance of weight loss  History of migraine with aura: Currently headache free.  16 weeks post partum-not breast-feeding  Disposition: Remain inpatient  Antimicrobial agents  See below  Anti-infectives    None      DVT Prophylaxis: Prophylactic Heparin   Code Status: Full code   Family Communication None at bedside  Procedures: None  CONSULTS:  neurology  Time spent 30 minutes-Greater than 50% of this time was spent in counseling, explanation of diagnosis, planning of further management, and coordination of care.  MEDICATIONS: Scheduled Meds: . aspirin  300 mg Rectal Daily   Or  . aspirin  325 mg Oral Daily  . atorvastatin  20 mg Oral q1800  . heparin  5,000 Units Subcutaneous 3 times per day  . pantoprazole  40 mg Oral Q1200   Continuous Infusions:  PRN Meds:.acetaminophen, senna-docusate    PHYSICAL EXAM: Vital signs in last 24 hours: Filed Vitals:   05/17/15 0200 05/17/15 0400 05/17/15 0600 05/17/15 0928  BP: 112/78 121/78 108/78 105/76  Pulse: 64 70 57 68  Temp: 98.6 F (37 C) 98.6 F (37 C) 98.6 F (37 C) 98.4 F (36.9 C)    TempSrc: Oral Oral Oral Oral  Resp: 14 16 16 18   Height:      Weight:      SpO2: 98% 99% 100% 99%    Weight change:  Filed Weights   05/16/15 2205  Weight: 92.534 kg (204 lb)   Body mass index is 35 kg/(m^2).   Gen Exam: Awake and alert with clear speech.   Neck: Supple, No JVD.   Chest: B/L Clear.   CVS: S1 S2 Regular, no murmurs.  Abdomen: soft, BS +, non tender, non distended.  Extremities: no edema, lower extremities warm to touch. Neurologic: Mild right upper extremity weakness, right lower extremity around 3+-but poor effort Skin: No Rash.   Wounds: N/A.   Intake/Output from previous day: No intake or output data in the 24 hours ending 05/17/15 1116   LAB RESULTS: CBC  Recent Labs Lab 05/16/15 1349  WBC 6.9  HGB 13.9  HCT 43.2  PLT 276  MCV 87.1  MCH 28.0  MCHC 32.2  RDW 14.0    Chemistries   Recent Labs Lab 05/16/15 1349  NA 138  K 3.7  CL 108  CO2 23  GLUCOSE 96  BUN 18  CREATININE 0.64  CALCIUM 9.0    CBG: No results for input(s): GLUCAP in the last 168 hours.  GFR Estimated Creatinine Clearance: 99.9 mL/min (by C-G formula based on Cr of 0.64).  Coagulation profile  Recent Labs Lab 05/16/15 1349  INR 0.93    Cardiac Enzymes No results for input(s): CKMB, TROPONINI, MYOGLOBIN in the last 168 hours.  Invalid input(s): CK  Invalid input(s): POCBNP No results for input(s): DDIMER in the last 72 hours. No results for input(s): HGBA1C in the last 72 hours.  Recent Labs  05/17/15 0630  CHOL 201*  HDL 59  LDLCALC 123*  TRIG 94  CHOLHDL 3.4    Recent Labs  05/17/15 0630  TSH 0.759   No results for input(s): VITAMINB12, FOLATE, FERRITIN, TIBC, IRON, RETICCTPCT in the last 72 hours. No results for input(s): LIPASE, AMYLASE in the last 72 hours.  Urine Studies No results for input(s): UHGB, CRYS in the last 72 hours.  Invalid input(s): UACOL, UAPR, USPG, UPH, UTP, UGL, UKET, UBIL, UNIT, UROB, ULEU, UEPI, UWBC,  URBC, UBAC, CAST, UCOM, BILUA  MICROBIOLOGY: No results found for this or any previous visit (from the past 240 hour(s)).  RADIOLOGY STUDIES/RESULTS: Ct Angio Head W/cm &/or Wo Cm  05/16/2015  CLINICAL DATA:  RIGHT leg weakness beginning today. Follow-up frontal lobe infarct. History of migraine. EXAM: CT ANGIOGRAPHY HEAD AND NECK TECHNIQUE: Multidetector CT imaging of the head and neck was performed using the standard protocol during bolus administration of intravenous contrast. Multiplanar CT image reconstructions and MIPs were obtained to evaluate the vascular anatomy. Carotid stenosis measurements (when applicable) are obtained utilizing NASCET criteria, using the distal internal carotid diameter as the denominator. CONTRAST:  80mL OMNIPAQUE IOHEXOL 350 MG/ML SOLN COMPARISON:  MRI of the brain May 16, 2015 at 1859 hours. FINDINGS: CTA NECK Aortic arch: Normal appearance of the thoracic arch, normal branch pattern. Two vessel aortic arch. The origins of the innominate, left Common carotid artery and subclavian artery are widely patent. Right carotid system: Common carotid artery is widely patent, coursing in a straight line fashion. Normal appearance of the carotid bifurcation without hemodynamically significant stenosis by NASCET criteria. Normal appearance of the included internal carotid artery. Left carotid system: Common carotid artery is widely patent, coursing in a straight line fashion. Normal appearance of the carotid bifurcation without hemodynamically significant stenosis by NASCET criteria. Normal appearance of the included internal carotid artery. Vertebral arteries:Left vertebral artery is dominant. Normal appearance of the vertebral arteries, which appear widely patent. Skeleton: No acute osseous process though bone windows have not been submitted. Other neck: Soft tissues of the neck are nonacute though, not tailored for evaluation. Minimal calcific atherosclerosis of the carotid  arteries. Calcified hilar lymph nodes. CTA HEAD Anterior circulation: Normal appearance of the cervical internal carotid arteries, petrous, cavernous and supra clinoid internal carotid arteries. Widely patent anterior communicating artery. Widely patent anterior and middle cerebral arteries. Very mild luminal regularity of the mid to distal middle cerebral arteries. Posterior circulation: Normal appearance of the vertebral arteries, vertebrobasilar junction and basilar artery, as well as main branch vessels. Normal appearance of the posterior cerebral arteries. No large vessel occlusion, hemodynamically significant stenosis, dissection, luminal irregularity, contrast extravasation or aneurysm within the anterior nor posterior circulation. Minimal luminal irregularity of the mid to distal segments. No abnormal intracranial enhancement on delayed phase. IMPRESSION: CT HEAD:  No abnormal intracranial enhancement. CTA NECK:  Normal. CTA HEAD: No acute large vessel occlusion or high-grade stenosis. Minimal luminal irregularity, which can be seen with atherosclerosis, possible vasculopathy. Electronically Signed   By: Awilda Metro M.D.   On: 05/16/2015 22:08   Ct Head Wo Contrast  05/16/2015  CLINICAL DATA:  RIGHT leg weakness. This occurred after driving a car for short distance. EXAM: CT HEAD WITHOUT CONTRAST TECHNIQUE: Contiguous axial images were obtained from the base of the skull through the vertex without intravenous  contrast. COMPARISON:  None. FINDINGS: No evidence for acute infarction, hemorrhage, mass lesion, hydrocephalus, or extra-axial fluid. No atrophy or white matter disease. Intact calvarium. Negative orbits. Incompletely visualized LEFT maxillary sinus disease. IMPRESSION: No acute intracranial findings. No visible acute stroke, hemorrhage, or signs of large vessel occlusion. Cannot exclude acute LEFT maxillary sinus disease. Electronically Signed   By: Elsie Stain M.D.   On: 05/16/2015 14:17    Ct Angio Neck W/cm &/or Wo/cm  05/16/2015  CLINICAL DATA:  RIGHT leg weakness beginning today. Follow-up frontal lobe infarct. History of migraine. EXAM: CT ANGIOGRAPHY HEAD AND NECK TECHNIQUE: Multidetector CT imaging of the head and neck was performed using the standard protocol during bolus administration of intravenous contrast. Multiplanar CT image reconstructions and MIPs were obtained to evaluate the vascular anatomy. Carotid stenosis measurements (when applicable) are obtained utilizing NASCET criteria, using the distal internal carotid diameter as the denominator. CONTRAST:  80mL OMNIPAQUE IOHEXOL 350 MG/ML SOLN COMPARISON:  MRI of the brain May 16, 2015 at 1859 hours. FINDINGS: CTA NECK Aortic arch: Normal appearance of the thoracic arch, normal branch pattern. Two vessel aortic arch. The origins of the innominate, left Common carotid artery and subclavian artery are widely patent. Right carotid system: Common carotid artery is widely patent, coursing in a straight line fashion. Normal appearance of the carotid bifurcation without hemodynamically significant stenosis by NASCET criteria. Normal appearance of the included internal carotid artery. Left carotid system: Common carotid artery is widely patent, coursing in a straight line fashion. Normal appearance of the carotid bifurcation without hemodynamically significant stenosis by NASCET criteria. Normal appearance of the included internal carotid artery. Vertebral arteries:Left vertebral artery is dominant. Normal appearance of the vertebral arteries, which appear widely patent. Skeleton: No acute osseous process though bone windows have not been submitted. Other neck: Soft tissues of the neck are nonacute though, not tailored for evaluation. Minimal calcific atherosclerosis of the carotid arteries. Calcified hilar lymph nodes. CTA HEAD Anterior circulation: Normal appearance of the cervical internal carotid arteries, petrous, cavernous and  supra clinoid internal carotid arteries. Widely patent anterior communicating artery. Widely patent anterior and middle cerebral arteries. Very mild luminal regularity of the mid to distal middle cerebral arteries. Posterior circulation: Normal appearance of the vertebral arteries, vertebrobasilar junction and basilar artery, as well as main branch vessels. Normal appearance of the posterior cerebral arteries. No large vessel occlusion, hemodynamically significant stenosis, dissection, luminal irregularity, contrast extravasation or aneurysm within the anterior nor posterior circulation. Minimal luminal irregularity of the mid to distal segments. No abnormal intracranial enhancement on delayed phase. IMPRESSION: CT HEAD:  No abnormal intracranial enhancement. CTA NECK:  Normal. CTA HEAD: No acute large vessel occlusion or high-grade stenosis. Minimal luminal irregularity, which can be seen with atherosclerosis, possible vasculopathy. Electronically Signed   By: Awilda Metro M.D.   On: 05/16/2015 22:08   Mr Brain Wo Contrast  05/16/2015  CLINICAL DATA:  44 year old female with right lower extremity numbness and weakness of acute onset. Initial encounter. EXAM: MRI HEAD WITHOUT CONTRAST TECHNIQUE: Multiplanar, multiecho pulse sequences of the brain and surrounding structures were obtained without intravenous contrast. COMPARISON:  Head CT without contrast 1351 hours today. FINDINGS: Punctate focus of increased trace diffusion in the left posterior MCA white matter near the peri-Rolandic cortex (series 4, image 41) does appear restricted on the ADC map (series 400, image 40) but is not well correlated on coronal diffusion images. No associated T2 or FLAIR hyperintensity. Major intracranial vascular flow voids are within normal  limits. No other restricted diffusion. No midline shift, mass effect, evidence of mass lesion, ventriculomegaly, extra-axial collection or acute intracranial hemorrhage. Cervicomedullary  junction and pituitary are within normal limits. Negative visualized cervical spine. Normal gray and white matter signal on non diffusion sequences. No chronic cerebral blood products or encephalomalacia identified. Visible internal auditory structures appear normal. Mastoids are clear. Fluid in bubbly opacity in the left maxillary sinus with mild paranasal sinus mucosal thickening elsewhere. Negative orbit and scalp soft tissues. IMPRESSION: 1. Punctate acute lacunar infarct in the subcortical white matter of the posterior left frontal lobe near the peri-Rolandic cortex (series 4, image 41). No associated hemorrhage or mass effect. 2. Otherwise negative noncontrast MRI appearance of the brain. 3. Left maxillary sinus inflammation. Electronically Signed   By: Odessa Fleming M.D.   On: 05/16/2015 19:23   Mm Digital Screening Bilateral  05/02/2015  CLINICAL DATA:  Screening. EXAM: DIGITAL SCREENING BILATERAL MAMMOGRAM WITH CAD COMPARISON:  Previous exam(s). ACR Breast Density Category c: The breast tissue is heterogeneously dense, which may obscure small masses. FINDINGS: There are no findings suspicious for malignancy. Images were processed with CAD. IMPRESSION: No mammographic evidence of malignancy. A result letter of this screening mammogram will be mailed directly to the patient. RECOMMENDATION: Screening mammogram in one year. (Code:SM-B-01Y) BI-RADS CATEGORY  1: Negative. Electronically Signed   By: Amie Portland M.D.   On: 05/02/2015 10:39    Jeoffrey Massed, MD  [facility] Pager:[phone]  If 7PM-7AM, please contact night-coverage [email or web address] Password [email or web address] 05/17/2015, 11:16 AM   LOS: 1 day

## 2015-05-17 NOTE — Progress Notes (Signed)
  Echocardiogram 2D Echocardiogram has been performed.  Brandy Brady 05/17/2015, 1:28 PM

## 2015-05-17 NOTE — Evaluation (Signed)
Physical Therapy Evaluation Patient Details Name: Brandy Brady MRN: [id number] DOB: 05/24/1970 Today's Date: 05/17/2015   History of Present Illness  Brandy Brady is a 44 y.o. female with no significant past medical history other than recent childbirth 16 weeks ago who presents with small infarct causing right-sided weakness. CT: punctate acute lacunar infarct in the subcortical white matter of the posterior left frontal lobe near the peri-Rolandic cortex.     Clinical Impression  Pt admitted with above. Noted inconsistency with report of symptoms and functional abilities. Pt functioning at supervision for transfers and min guard for ambulation with use of RW. Pt given HEP for R LE to improve strength. Recommend HHPT to address R LE weakness and progress indep with mobility. Educated that pt wont be able to primarily care for infant has she will be dependent on RW to amb for a little. Pt reports he husband, 41 yo son and mother in law will be available to assist pt with house chores, cooking and child care.    Follow Up Recommendations Home health PT;Supervision/Assistance - 24 hour    Equipment Recommendations  Rolling walker with 5" wheels (shower seat)    Recommendations for Other Services       Precautions / Restrictions Precautions Precautions: Fall Restrictions Weight Bearing Restrictions: No      Mobility  Bed Mobility Overal bed mobility: Needs Assistance Bed Mobility: Supine to Sit;Sit to Supine     Supine to sit: Supervision;HOB elevated Sit to supine: Supervision   General bed mobility comments: definite use of bed rail, increased time, able to manage R LE in/out of bed without assist, pt required no physical assist  Transfers Overall transfer level: Needs assistance Equipment used: Rolling walker (2 wheeled) Transfers: Sit to/from Stand Sit to Stand: Min guard         General transfer comment: v/c's for safety and hand placement, pt slow and guarded but  required no physical assist. Pt  held R LE out and placed minimal WBing on it  Ambulation/Gait Ambulation/Gait assistance: Min guard;Min assist Ambulation Distance (Feet): 60 Feet Assistive device: Rolling walker (2 wheeled) Gait Pattern/deviations: Step-to pattern;Decreased step length - right;Decreased stance time - right;Decreased stride length;Decreased dorsiflexion - right Gait velocity: decreased Gait velocity interpretation: Below normal speed for age/gender General Gait Details: pt dependent on UEs, encouraged pt to place R foot flat and bear weight however pt con't to walk on ball of R foot  Stairs            Wheelchair Mobility    Modified Rankin (Stroke Patients Only) Modified Rankin (Stroke Patients Only) Pre-Morbid Rankin Score: No symptoms Modified Rankin: Moderately severe disability     Balance Overall balance assessment: Needs assistance Sitting-balance support: Feet supported;No upper extremity supported Sitting balance-Leahy Scale: Good     Standing balance support: Bilateral upper extremity supported Standing balance-Leahy Scale: Poor Standing balance comment: pt needs support of RW for safe standing                             Pertinent Vitals/Pain Pain Assessment: No/denies pain    Home Living Family/patient expects to be discharged to:: Private residence Living Arrangements: Spouse/significant other (and 5 kids) Available Help at Discharge: Family;Available 24 hours/day Type of Home: House Home Access: Stairs to enter Entrance Stairs-Rails: None Entrance Stairs-Number of Steps: 3 Home Layout: One level Home Equipment: None      Prior Function Level of Independence:  Independent         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 Pt able to hold R  UE up off the floor without difficulty (pt with questionable effort)    Cervical / Trunk Assessment: Normal  Communication   Communication: No difficulties  Cognition Arousal/Alertness: Awake/alert Behavior During Therapy: WFL for tasks assessed/performed Overall Cognitive Status: Within Functional Limits for tasks assessed (hyperverbal)                      General Comments      Exercises General Exercises - Lower Extremity Ankle Circles/Pumps: AAROM;Right;10 reps;Supine Quad Sets: AROM;Right;10 reps;Supine Heel Slides: AAROM;Right;10 reps;Supine      Assessment/Plan    PT Assessment Patient needs continued PT services  PT Diagnosis Difficulty walking;Generalized weakness   PT Problem List Decreased strength;Decreased activity tolerance;Decreased balance;Decreased mobility  PT Treatment Interventions DME instruction;Gait training;Stair training;Functional mobility training;Therapeutic activities;Therapeutic exercise;Balance training;Neuromuscular re-education   PT Goals (Current goals can be found in the Care Plan section) Acute Rehab PT Goals Patient Stated Goal: home tomorrow PT Goal Formulation: With patient Time For Goal Achievement: 05/24/15 Potential to Achieve Goals: Good    Frequency Min 4X/week   Barriers to discharge   pt has 3 steps to enter    Co-evaluation               End of Session Equipment Utilized During Treatment: Gait belt Activity Tolerance: Patient tolerated treatment well Patient left: in bed;with call bell/phone within reach           Time: 1124-1152 PT Time Calculation (min) (ACUTE ONLY): 28  min   Charges:   PT Evaluation $Initial PT Evaluation Tier I: 1 Procedure PT Treatments $Gait Training: 8-22 mins   PT G CodesMarcene Brawn:        Kenyette Gundy Marie 05/17/2015, 1:57 PM   Lewis ShockAshly Maeve Debord, PT, DPT Pager #: [phone] Office #: [phone]

## 2015-05-17 NOTE — Progress Notes (Signed)
Vascular ultrasound. Transcranial bubble study has been completed. Dr. Roda ShuttersXu performed procedure. Risks/ benefits were explained and verbal consent was taken. The Right middle cerebral artery was insonated. IV in the right hand was used. 0 HITS heard at rest. 0 HITS heard during valsalva. No apparent PFO.   Farrel DemarkJill Eunice, RDMS, RVT 05/17/2015

## 2015-05-17 NOTE — Evaluation (Signed)
Speech Language Pathology Evaluation Patient Details Name: Brandy Brady MRN: [id number] DOB: 12/06/1970 Today's Date: 05/17/2015 Time: 1000-1014 SLP Time Calculation (min) (ACUTE ONLY): 14 min  Problem List:  Patient Active Problem List   Diagnosis Date Noted  . CVA (cerebral infarction) 05/16/2015  . Stroke (cerebrum) (HCC) 05/16/2015  . KNEE PAIN 05/24/2010  . HEMORRHOIDS, INTERNAL 07/09/2009  . KERATOSIS PILARIS 05/23/2009  . VITAMIN D DEFICIENCY 07/19/2008  . MIGRAINE W/AURA W/O INTRACT W/O STATUS MIGRNOSUS 04/27/2008  . NUMBNESS 03/22/2008  . OBESITY 02/03/2008  . GERD 02/03/2008   Past Medical History:  Past Medical History  Diagnosis Date  . GERD (gastroesophageal reflux disease)   . Obesity   . Migraine    Past Surgical History:  Past Surgical History  Procedure Laterality Date  . Cholecystectomy    . Tubal ligation  01/2015  . Cesarean section  01/19/2015   HPI:  Brandy Brady is a 44 y.o. female with no significant past medical history other than recent childbirth 16 weeks ago who presents with small infarct causing right-sided weakness. CT: punctate acute lacunar infarct in the subcortical white matter of the posterior left frontal lobe near the peri-Rolandic cortex.    Assessment / Plan / Recommendation Clinical Impression  Patient presents with mild residual dysarthria s/p recent CVA. Would benefit from SLP f/u in the acute setting as well as post d/c. Recommend CIR consult.     SLP Assessment  Patient needs continued Speech Lanaguage Pathology Services    Follow Up Recommendations  Inpatient Rehab    Frequency and Duration min 1 x/week  1 week      SLP Evaluation Prior Functioning  Cognitive/Linguistic Baseline: Within functional limits   Cognition  Overall Cognitive Status: Within Functional Limits for tasks assessed    Comprehension  Auditory Comprehension Overall Auditory Comprehension: Appears within functional limits for tasks  assessed Visual Recognition/Discrimination Discrimination: Within Function Limits Reading Comprehension Reading Status: Within funtional limits    Expression Expression Primary Mode of Expression: Verbal Verbal Expression Overall Verbal Expression: Appears within functional limits for tasks assessed   Oral / Motor Oral Motor/Sensory Function Overall Oral Motor/Sensory Function: Mild impairment Facial ROM: Within Functional Limits (hesitant) Facial Symmetry: Within Functional Limits Facial Strength: Within Functional Limits Facial Sensation: Within Functional Limits Lingual ROM: Within Functional Limits (slow movement) Lingual Symmetry: Within Functional Limits Lingual Strength: Within Functional Limits Lingual Sensation: Within Functional Limits Velum: Within Functional Limits Mandible: Within Functional Limits Motor Speech Overall Motor Speech: Impaired Respiration: Within functional limits Phonation: Normal Resonance: Within functional limits Articulation: Impaired Level of Impairment: Sentence Intelligibility: Intelligibility reduced Conversation: 75-100% accurate Motor Planning: Witnin functional limits   UnitedHealthLeah Westyn Driggers MA, CCC-SLP [phone]  Brandy Brady 05/17/2015, 10:29 AM

## 2015-05-17 NOTE — Care Management Note (Signed)
Case Management Note  Patient Details  Name: Valetta CloseMaria D Tirado MRN: [id number] Date of Birth: Jun 17, 1970  Subjective/Objective:   Patient admitted with CVA. Patient is 16 weeks postpartum.  Patient lives at home with her husband and is independent with ADL's.                 Action/Plan: Awaiting further testing and PT/OT recommendations. CM will continue to follow for discharge needs.   Expected Discharge Date:                  Expected Discharge Plan:     In-House Referral:     Discharge planning Services     Post Acute Care Choice:    Choice offered to:     DME Arranged:    DME Agency:     HH Arranged:    HH Agency:     Status of Service:  In process, will continue to follow  Medicare Important Message Given:    Date Medicare IM Given:    Medicare IM give by:    Date Additional Medicare IM Given:    Additional Medicare Important Message give by:     If discussed at Long Length of Stay Meetings, dates discussed:    Additional Comments:  Kermit BaloKelli F Lavaughn Bisig, RN 05/17/2015, 10:20 AM

## 2015-05-17 NOTE — Progress Notes (Signed)
STROKE TEAM PROGRESS NOTE   HISTORY Brandy Brady is a 44 y.o. female with no significant past medical history other than recent childbirth 16 weeks ago who presents with small infarct causing right-sided weakness. She states that initially it was less severe, but came and went at least 2 times but now since she has been met Brandy Brady and is persistent. She did notice that she had some posterior neck pain that started yesterday and she thought she had "slept on her neck wrong." She denies any other pain. He was last known well 1220 01/06/2015 at 12 noon. Patient was not administered TPA secondary to initially symptoms had improved, but never went back to completely normal, then worsened again. She was admitted for further evaluation and treatment.  She initially presented to Brandy Brady and was transferre to Brandy Brady.  SUBJECTIVE (INTERVAL HISTORY)  No family is at the bedside.  Overall she feels her condition is stable. She still has weakness in her R leg, she can now move it from side to side in the bed.   She reports she first started feeling something was wrong with her R leg at 12 noon yesterday 05/16/2015. She has a hx of migraines, but the R leg weakness was new. She called a friend who advised her to go to the hospital. When she got to the hospital, she had neuro worsening with R leg weakness, and then got better again, then worsened. Her arm also got tingly and numb when she had leg worsening but resolved. (total of 2 episodes of worsening).  Brandy Brady laid off on Thanksgiving. He has resumed working 2 weeks ago but she is concerned about finances and kids. She had complicated pregnancy and delivery for her two kids 3 years ago and 16 weeks ago.  OBJECTIVE Temp:  [98.2 F (36.8 C)-99 F (37.2 C)] 98.4 F (36.9 C) (12/29 0928) Pulse Rate:  [57-91] 68 (12/29 0928) Cardiac Rhythm:  [-] Sinus bradycardia (12/29 0700) Resp:  [14-29] 18 (12/29 0928) BP: (105-135)/(74-94) 105/76 mmHg (12/29  0928) SpO2:  [97 %-100 %] 99 % (12/29 0928) Weight:  [92.534 kg (204 lb)] 92.534 kg (204 lb) (12/28 2205)  CBC:   Recent Labs Lab 05/16/15 1349  WBC 6.9  HGB 13.9  HCT 43.2  MCV 87.1  PLT 518    Basic Metabolic Panel:   Recent Labs Lab 05/16/15 1349  NA 138  K 3.7  CL 108  CO2 23  GLUCOSE 96  BUN 18  CREATININE 0.64  CALCIUM 9.0    Lipid Panel:     Component Value Date/Time   CHOL 201* 05/17/2015 0630   TRIG 94 05/17/2015 0630   HDL 59 05/17/2015 0630   CHOLHDL 3.4 05/17/2015 0630   VLDL 19 05/17/2015 0630   LDLCALC 123* 05/17/2015 0630   HgbA1c: No results found for: HGBA1C Urine Drug Screen: No results found for: LABOPIA, COCAINSCRNUR, LABBENZ, AMPHETMU, THCU, LABBARB    IMAGING I have personally reviewed the radiological images below and agree with the radiology interpretations. Blue text is my interpretation.  Ct Head Wo Contrast  05/16/2015  No acute intracranial findings. No visible acute stroke, hemorrhage, or signs of large vessel occlusion. Cannot exclude acute LEFT maxillary sinus disease.   05/16/2015  No abnormal intracranial enhancement.   CTA NECK:    05/16/2015  Normal.   CTA HEAD:   05/16/2015  No acute large vessel occlusion or high-grade stenosis. Minimal luminal irregularity, which can be seen with atherosclerosis, possible  vasculopathy.   Mr Brain Wo Contrast  05/16/2015   1. Punctate acute lacunar infarct in the subcortical white matter of the posterior left frontal lobe near the peri-Rolandic cortex (series 4, image 41). No associated hemorrhage or mass effect. 2. Otherwise negative noncontrast MRI appearance of the brain. 3. Left maxillary sinus inflammation. The above mentioned punctate DWI changes was so subtle that can not be reliably appreciated. And it is not correlating to pt's symptom, therefore, it is really questionable as per my opinion.   2D echo - pending  LE venous doppler - pending  TCD bubble study -  pending  Hypercoagulable and autoimmune work up - pending   PHYSICAL EXAM  Temp:  [98 F (36.7 C)-99 F (37.2 C)] 98 F (36.7 C) (12/29 1405) Pulse Rate:  [57-91] 79 (12/29 1405) Resp:  [14-29] 19 (12/29 1405) BP: (105-135)/(74-94) 106/77 mmHg (12/29 1405) SpO2:  [97 %-100 %] 100 % (12/29 1405) Weight:  [204 lb (92.534 kg)] 204 lb (92.534 kg) (12/28 2205)  General - Well nourished, well developed, in no apparent distress.  Ophthalmologic - Sharp disc margins OU.   Cardiovascular - Regular rate and rhythm with no murmur.  Mental Status -  Level of arousal and orientation to time, place, and person were intact. Language including expression, naming, repetition, comprehension was assessed and found intact. Fund of Knowledge was assessed and was intact.  Cranial Nerves II - XII - II - Visual field intact OU. III, IV, VI - Extraocular movements intact. V - Facial sensation intact bilaterally. VII - Facial movement intact bilaterally. VIII - Hearing & vestibular intact bilaterally. X - Palate elevates symmetrically. XI - Chin turning & shoulder shrug intact bilaterally. XII - Tongue protrusion intact.  Motor Strength - The patient's strength was normal in LUE and LLE and RUE and pronator drift was absent. However on RLE testing, pt showed inconsistent muscle strength, 2+/5 initially but much stronger with encouragement and effort up to 4/5.  Bulk was normal and fasciculations were absent.   Motor Tone - Muscle tone was assessed at the neck and appendages and was normal.  Reflexes - The patient's reflexes were 1+ in all extremities and she had no pathological reflexes.  Sensory - Light touch, temperature/pinprick were assessed and were symmetrical.    Coordination - The patient had normal movements in the hands and feet with no ataxia or dysmetria.  Tremor was absent.  Gait and Station - not tested by myself but as per RN, she needs help to go to bathroom but walking with  right toe waling.   ASSESSMENT/PLAN [name] is a 44 y.o. female with no significant past medical history except for childbirth 16 weeks ago presenting with right leg weakness. She did not receive IV t-PA on arrival to [facility] due to improving symptoms.   Questionable stroke:  questionable tiny left frontal subcortical infarct maybe secondary to post-partum hypercoagulable state vs small vessel disease source (risk factor of migraine with aura and HLD).  However, this is not fit to pt symptoms and there are psychogenic component on pt history and examination.  Size of stroke seen on MRI does not correlate with degree of R LE hemiparesis, appears to have a functional component.  Resultant  R leg paresis with functional component  CTA head with no significant large vessel stensosis  CTA neck Unremarkable   MRI  Really questionable left frontal subcortical infarct  2D Echo  pending   TCD bubble study pending  LE venous dopplers pending   Hypercoagulable and autoimmune work up pending  LDL 123  HgbA1c pending  Heparin 5000 units sq tid for VTE prophylaxis Diet regular Room service appropriate?: Yes; Fluid consistency:: Thin  No antithrombotic prior to admission, now on aspirin 325 mg daily. Continue ASA on discharge.  Patient counseled to be compliant with her antithrombotic medications  Ongoing aggressive stroke risk factor management  Therapy recommendations: HH   Disposition:  pending   Hyperlipidemia  Home meds:  No statin  LDL 123, goal < 70  Add lipitor 20  Continue statin at discharge  Other Stroke Risk Factors  ETOH use  Obesity, Body mass index is 35 kg/(m^2).   Migraine with aura x 18 years. (photophobia, intolerant to sounds, visual aura w/ 1 eye twitch, B sparkle lights (1hr prior to HA), takes aleve within 1 hr and it helps, otherwise, lasts for whole day)  Postpartum state 16 weeks  Stress at home with newborn and  [facility] day # 1  Rosalin Hawking, MD PhD Stroke Neurology 05/17/2015 2:35 PM  To contact Stroke Continuity provider, please refer to [email or web address] After hours, contact General Neurology

## 2015-05-17 NOTE — Procedures (Addendum)
TRANSCRANIAL DOPPLER BUBBLE STUDY  Brandy CloseMaria D Brady  Date of Birth: Oct 03, 1970 Medical Record Number: [id number] Indications: questionable stroke Date of Procedure: 05/17/2015 Clinical History: questionable stroke and migraine with aura Technical Description: Transcranial Doppler Bubble Study was performed at the bedside after taking written informed consent from the patient and explaining risk/benefits. The right middle cerebral artery was insonated using a hand held probe. And IV line had been previously inserted in the right forearm by the RN using aseptic precautions. Agitated saline injection at rest and after valsalva maneuver did not result in high intensity transient signals (HITS).  Impression: negative Transcranial Doppler Bubble Study indicative of no right to left shunt   Results were explained to the patient. Questions were answered. Due to negative result, LE venous doppler was cancelled.  Marvel PlanJindong Jowan Skillin, MD PhD Stroke Neurology 05/17/2015 3:40 PM

## 2015-05-18 DIAGNOSIS — I639 Cerebral infarction, unspecified: Principal | ICD-10-CM | POA: Insufficient documentation

## 2015-05-18 LAB — HEMOGLOBIN A1C
Hgb A1c MFr Bld: 5.3 % (ref 4.8–5.6)
Mean Plasma Glucose: 105 mg/dL

## 2015-05-18 LAB — PROTEIN C, TOTAL: PROTEIN C, TOTAL: 93 % (ref 60–150)

## 2015-05-18 LAB — ANCA TITERS
Atypical P-ANCA titer: <1:20 {titer}
P-ANCA: <1:20 {titer}

## 2015-05-18 LAB — HOMOCYSTEINE: HOMOCYSTEINE-NORM: 7.6 umol/L (ref 0.0–15.0)

## 2015-05-18 LAB — CARDIOLIPIN ANTIBODIES, IGG, IGM, IGA
Anticardiolipin IgG: <9 GPL U/mL (ref 0–14)
Anticardiolipin IgM: <9 MPL U/mL (ref 0–12)

## 2015-05-18 LAB — RHEUMATOID FACTOR

## 2015-05-18 LAB — SJOGRENS SYNDROME-A EXTRACTABLE NUCLEAR ANTIBODY

## 2015-05-18 LAB — ANTINUCLEAR ANTIBODIES, IFA: ANA Ab, IFA: NEGATIVE

## 2015-05-18 LAB — SJOGRENS SYNDROME-B EXTRACTABLE NUCLEAR ANTIBODY

## 2015-05-18 LAB — ANTI-DNA ANTIBODY, DOUBLE-STRANDED: ds DNA Ab: 1 IU/mL (ref 0–9)

## 2015-05-18 MED ORDER — ASPIRIN 325 MG PO TABS: 325.0000 mg | ORAL_TABLET | Freq: Every day | ORAL | Status: AC

## 2015-05-18 MED ORDER — ATORVASTATIN CALCIUM 20 MG PO TABS: 20.0000 mg | ORAL_TABLET | Freq: Every day | ORAL | Status: DC

## 2015-05-18 NOTE — Progress Notes (Signed)
Physical Therapy Treatment Patient Details Name: Brandy CloseMaria D Brady MRN: [id number] DOB: 07/02/1970 Today's Date: 05/18/2015    History of Present Illness Brandy CloseMaria D Brady is a 44 y.o. female with no significant past medical history other than recent childbirth 16 weeks ago who presents with small infarct causing right-sided weakness. CT: punctate acute lacunar infarct in the subcortical white matter of the posterior left frontal lobe near the peri-Rolandic cortex.       PT Comments    Pt making good progress. Will return home with family and HHPT.  Follow Up Recommendations  Home health PT;Supervision/Assistance - 24 hour     Equipment Recommendations  Rolling walker with 5" wheels    Recommendations for Other Services       Precautions / Restrictions Precautions Precautions: Fall Restrictions Weight Bearing Restrictions: No    Mobility  Bed Mobility Overal bed mobility: Modified Independent             General bed mobility comments: HOB up, no use of rail or physical assist, able to advance R LE to EOB without using UEs  Transfers Overall transfer level: Needs assistance Equipment used: Rolling walker (2 wheeled) Transfers: Sit to/from Stand Sit to Stand: Supervision         General transfer comment: verbal cues for hand placement with walker use from bed, chair and toilet  Ambulation/Gait Ambulation/Gait assistance: Supervision Ambulation Distance (Feet): 300 Feet Assistive device: Rolling walker (2 wheeled) Gait Pattern/deviations: Step-through pattern;Decreased step length - right;Decreased dorsiflexion - right Gait velocity: decreased Gait velocity interpretation: Below normal speed for age/gender General Gait Details: No physical assist. Slow, deliberate gait.   Stairs            Wheelchair Mobility    Modified Rankin (Stroke Patients Only) Modified Rankin (Stroke Patients Only) Pre-Morbid Rankin Score: No symptoms Modified Rankin:  Moderately severe disability     Balance Overall balance assessment: Needs assistance Sitting-balance support: No upper extremity supported;Feet supported Sitting balance-Leahy Scale: Good     Standing balance support: No upper extremity supported;During functional activity Standing balance-Leahy Scale: Fair                      Cognition Arousal/Alertness: Awake/alert Behavior During Therapy: WFL for tasks assessed/performed Overall Cognitive Status: Within Functional Limits for tasks assessed (hyperverbal)                      Exercises      General Comments        Pertinent Vitals/Pain Pain Assessment: No/denies pain    Home Living Family/patient expects to be discharged to:: Private residence Living Arrangements: Spouse/significant other (5 children from 4 months to 16 years) Available Help at Discharge: Family;Available 24 hours/day Type of Home: House Home Access: Stairs to enter Entrance Stairs-Rails: None Home Layout: One level Home Equipment: None      Prior Function Level of Independence: Independent      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, 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   PT Goals (current goals can now be found in the care plan section) Acute Rehab PT Goals Patient Stated Goal: home tomorrow Progress towards PT goals: Progressing toward goals    Frequency  Min 4X/week    PT Plan Current plan remains appropriate    Co-evaluation             End of Session   Activity Tolerance: Patient tolerated treatment well Patient left: with call bell/phone within reach;in chair  Time: 1610-9604 PT Time Calculation (min) (ACUTE ONLY): 20 min  Charges:  $Gait Training: 8-22 mins                    G Codes:      Danylle Ouk 06-16-2015, 12:16 PM [employer] PT [phone]

## 2015-05-18 NOTE — Evaluation (Signed)
Occupational Therapy Evaluation Patient Details Name: Brandy Brady MRN: [id number] DOB: 1970/09/04 Today's Date: 05/18/2015    History of Present Illness Brandy Brady is a 44 y.o. female with no significant past medical history other than recent childbirth 16 weeks ago who presents with small infarct causing right-sided weakness. CT: punctate acute lacunar infarct in the subcortical white matter of the posterior left frontal lobe near the peri-Rolandic cortex.      Clinical Impression   Pt demonstrating ability to perform self care and toilet transfers at a modified independent level.  Cues for hand placement with RW use.  Educated pt in theraputty exercise and strengthening home exercise program.  Signature presents as near baseline. Recommended pt have supervision for tub transfer and use tub seat for safety. Advised pt to avoid picking up her baby, will have assistance of her family until strength returns.  Pt verbalizing understanding of education.      Follow Up Recommendations  Home health OT    Equipment Recommendations  Tub/shower seat    Recommendations for Other Services       Precautions / Restrictions Precautions Precautions: Fall Restrictions Weight Bearing Restrictions: No      Mobility Bed Mobility Overal bed mobility: Modified Independent             General bed mobility comments: HOB up, no use of rail or physical assist, able to advance R LE to EOB without using UEs  Transfers Overall transfer level: Modified independent Equipment used: Rolling walker (2 wheeled)             General transfer comment: verbal cues for hand placement with walker use from bed, chair and toilet    Balance     Sitting balance-Leahy Scale: Good       Standing balance-Leahy Scale: Fair                              ADL Overall ADL's : Modified independent                                   Tub/Shower Transfer Details (indicate  cue type and reason): supervision for tub transfer Functional mobility during ADLs: Modified independent;Rolling walker General ADL Comments: Issued med soft theraputty and instructed in HEP. Recommended 2# free weight exercises for shoulder and elbow.     Vision     Perception     Praxis      Pertinent Vitals/Pain Pain Assessment: No/denies pain     Hand Dominance Right   Extremity/Trunk Assessment Upper Extremity Assessment Upper Extremity Assessment: RUE deficits/detail RUE Deficits / Details: 4+/5 shoulder, elbow flexion, 4-/5 elbow extension, 4/5 gross grasp RUE Coordination: decreased fine motor (mild deficits)   Lower Extremity Assessment Lower Extremity Assessment: Defer to PT evaluation   Cervical / Trunk Assessment Cervical / Trunk Assessment: Normal   Communication Communication Communication: No difficulties   Cognition Arousal/Alertness: Awake/alert Behavior During Therapy: WFL for tasks assessed/performed Overall Cognitive Status: Within Functional Limits for tasks assessed                     General Comments       Exercises       Shoulder Instructions      Home Living Family/patient expects to be discharged to:: Private residence Living Arrangements: Spouse/significant other (5 children from 4 months to  16 years) Available Help at Discharge: Family;Available 24 hours/day Type of Home: House Home Access: Stairs to enter [employer] of Steps: 3 Entrance Stairs-Rails: None Home Layout: One level     Bathroom Shower/Tub: Tub only   Firefighter: Standard     Home Equipment: None          Prior Functioning/Environment Level of Independence: Independent        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, 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    OT Diagnosis: Generalized weakness;Hemiplegia dominant side   OT Problem List: Decreased strength;Decreased coordination;Decreased knowledge of use of DME or AE;Impaired balance (sitting and/or standing)   OT  Treatment/Interventions:      OT Goals(Current goals can be found in the care plan section) Acute Rehab OT Goals Patient Stated Goal: home today  OT Frequency:     Barriers to D/C:            Co-evaluation              End of Session Equipment Utilized During Treatment: Rolling walker  Activity Tolerance: Patient tolerated treatment well Patient left: in chair;with call bell/phone within reach   Time: 0825-0914 OT Time Calculation (min): 49 min Charges:  OT General Charges $OT Visit: 1 Procedure OT Evaluation $Initial OT Evaluation Tier I: 1 Procedure OT Treatments $Self Care/Home Management : 8-22 mins $Therapeutic Exercise: 8-22 mins G-Codes:    Evern Bio 05/18/2015, 9:23 AM  [phone]

## 2015-05-18 NOTE — Progress Notes (Signed)
Pt discharged home with family. IV discontinued and discharge instructions given. Pt left unit with volunteer services via wheelchair at 1330. Lawson RadarHeather M Kiyon Fidalgo

## 2015-05-18 NOTE — Progress Notes (Signed)
STROKE TEAM PROGRESS NOTE   SUBJECTIVE (INTERVAL HISTORY) Patient up walking in the halls. Feels she is doing better. No acute issue overnight.   OBJECTIVE Temp:  [98 F (36.7 C)-98.4 F (36.9 C)] 98.4 F (36.9 C) (12/30 0935) Pulse Rate:  [58-79] 65 (12/30 0935) Cardiac Rhythm:  [-] Normal sinus rhythm (12/30 0729) Resp:  [18-70] 20 (12/30 0935) BP: (102-119)/(61-82) 109/71 mmHg (12/30 0935) SpO2:  [98 %-100 %] 100 % (12/30 0935)  CBC:   Recent Labs Lab 05/16/15 1349  WBC 6.9  HGB 13.9  HCT 43.2  MCV 87.1  PLT 276    Basic Metabolic Panel:   Recent Labs Lab 05/16/15 1349  NA 138  K 3.7  CL 108  CO2 23  GLUCOSE 96  BUN 18  CREATININE 0.64  CALCIUM 9.0    Lipid Panel:     Component Value Date/Time   CHOL 201* 05/17/2015 0630   TRIG 94 05/17/2015 0630   HDL 59 05/17/2015 0630   CHOLHDL 3.4 05/17/2015 0630   VLDL 19 05/17/2015 0630   LDLCALC 123* 05/17/2015 0630   HgbA1c:  Lab Results  Component Value Date   HGBA1C 5.3 05/17/2015   Urine Drug Screen: No results found for: LABOPIA, COCAINSCRNUR, LABBENZ, AMPHETMU, THCU, LABBARB    IMAGING Blue text is Dr. Warren Danes interpretation.  Ct Head Wo Contrast 05/16/2015  No acute intracranial findings. No visible acute stroke, hemorrhage, or signs of large vessel occlusion. Cannot exclude acute LEFT maxillary sinus disease.   05/16/2015  No abnormal intracranial enhancement.   CTA NECK:   05/16/2015  Normal.   CTA HEAD:  05/16/2015  No acute large vessel occlusion or high-grade stenosis. Minimal luminal irregularity, which can be seen with atherosclerosis, possible vasculopathy.   Mr Brain Wo Contrast 05/16/2015   1. Punctate acute lacunar infarct in the subcortical white matter of the posterior left frontal lobe near the peri-Rolandic cortex (series 4, image 41). No associated hemorrhage or mass effect. 2. Otherwise negative noncontrast MRI appearance of the brain. 3. Left maxillary sinus inflammation. The  above mentioned punctate DWI changes was so subtle that can not be reliably appreciated. And it is not correlating to pt's symptom, therefore, it is really questionable as per my opinion.   2D echo  - Left ventricle: The cavity size was normal. Systolic function was normal. The estimated ejection fraction was in the range of 55%to 60%. Wall motion was normal; there were no regional wallmotion abnormalities. - Left atrium: The atrium was mildly dilated. - Impressions:   No cardiac source of emboli was indentified.  TCD bubble study -negative Transcranial Doppler Bubble Study indicative of no right to left shunt  Hypercoagulable and autoimmune work up - negative thus far, other tests pending    PHYSICAL EXAM General - Well nourished, well developed, in no apparent distress.  Ophthalmologic - Sharp disc margins OU.   Cardiovascular - Regular rate and rhythm with no murmur.  Mental Status -  Level of arousal and orientation to time, place, and person were intact. Language including expression, naming, repetition, comprehension was assessed and found intact. Fund of Knowledge was assessed and was intact.  Cranial Nerves II - XII - II - Visual field intact OU. III, IV, VI - Extraocular movements intact. V - Facial sensation intact bilaterally. VII - Facial movement intact bilaterally. VIII - Hearing & vestibular intact bilaterally. X - Palate elevates symmetrically. XI - Chin turning & shoulder shrug intact bilaterally. XII - Tongue protrusion intact.  Motor Strength - The patient's strength was normal in LUE and LLE and RUE and pronator drift was absent. However on RLE testing, pt showed inconsistent muscle strength, 3/5 initially but much stronger with encouragement and effort up to 4+/5.  Bulk was normal and fasciculations were absent.   Motor Tone - Muscle tone was assessed at the neck and appendages and was normal.  Reflexes - The patient's reflexes were 1+ in all extremities  and she had no pathological reflexes.  Sensory - Light touch, temperature/pinprick were assessed and were symmetrical.    Coordination - The patient had normal movements in the hands and feet with no ataxia or dysmetria.  Tremor was absent.  Gait and Station - walking with walker in hallway with PT, mild right hemiparetic gait.   ASSESSMENT/PLAN [name] is a 44 y.o. female with no significant past medical history except for childbirth 16 weeks ago presenting with right leg weakness. She [facility] not receive [facility] due to improving symptoms.   Questionable stroke:  questionable tiny left frontal subcortical infarct maybe secondary to post-partum hypercoagulable state vs small vessel disease source (risk factor of migraine with aura and HLD).  However, this is not fit to pt symptoms and there are psychogenic component on pt history and examination.  Size of stroke seen on MRI does not correlate with degree of R LE hemiparesis, appears to have a functional component.  Resultant  R leg paresis with functional component  CTA head with no significant large vessel stensosis  CTA neck Unremarkable   MRI  Really questionable left frontal subcortical infarct  2D Echo  No source of embolus   TCD bubble study negative    Hypercoagulable and autoimmune work up neg thus far  LDL 123  HgbA1c 5.3  Heparin 5000 units sq tid for VTE prophylaxis Diet regular Room service appropriate?: Yes; Fluid consistency:: Thin Diet - low sodium heart healthy  No antithrombotic prior to admission, now on aspirin 325 mg daily. Continue ASA on discharge.  Patient counseled to be compliant with her antithrombotic medications  Ongoing aggressive stroke risk factor management  Therapy recommendations: HH   Disposition:  pending   Stroke will sign off  No neuro followup indicated   Hyperlipidemia  Home meds:  No statin  LDL 123, goal < 70  Add lipitor 20  Continue  statin at discharge  Other Stroke Risk Factors  ETOH use  Obesity, Body mass index is 35 kg/(m^2).   Migraine with aura x 18 years. (photophobia, intolerant to sounds, visual aura w/ 1 eye twitch, B sparkle lights (1hr prior to HA), takes aleve within 1 hr and it helps, otherwise, lasts for whole day)  Postpartum state 16 weeks  Stress at home with newborn and financial  Hospital day # 2  Neurology will sign off. Please call with questions. No neuro follow up needed at this time. Thanks for the consult.  Marvel PlanJindong Harlee Eckroth, MD PhD Stroke Neurology 05/18/2015 2:03 PM   To contact Stroke Continuity provider, please refer to [email or web address]. After hours, contact General Neurology

## 2015-05-18 NOTE — Discharge Summary (Signed)
PATIENT DETAILS Name: Brandy Brady Age: 44 y.o. Sex: female Date of Birth: 1970/08/13 MRN: [id number]. Admitting Physician: Lorretta Harp, MD WUJ:[id number], MD  Admit Date: 05/16/2015 Discharge date: 05/18/2015  Recommendations for Outpatient Follow-up:  1. Consider outpatient psychiatry evaluation.  2. Ensure follow-up with neurology  3. Repeat lipid panel in 3 months.  4. Hypercoagulable workup/autoimmune panel-pending please follow  PRIMARY DISCHARGE DIAGNOSIS:  Principal Problem:   CVA (cerebral infarction) Active Problems:   OBESITY   GERD   Cerebrovascular accident (CVA) (HCC)   HLD (hyperlipidemia)   Postpartum complication   Migraine with aura and without status migrainosus, not intractable      PAST MEDICAL HISTORY: Past Medical History  Diagnosis Date  . GERD (gastroesophageal reflux disease)   . Obesity   . Migraine     DISCHARGE MEDICATIONS: Current Discharge Medication List    START taking these medications   Details  aspirin 325 MG tablet Take 1 tablet (325 mg total) by mouth daily. Qty: 30 tablet, Refills: 0    atorvastatin (LIPITOR) 20 MG tablet Take 1 tablet (20 mg total) by mouth daily at 6 PM. Qty: 30 tablet, Refills: 0        ALLERGIES:  No Known Allergies  BRIEF HPI:  See H&P, Labs, Consult and Test reports for all details in brief, patient was admitted for evaluation of right-sided weakness.   CONSULTATIONS:   neurology  PERTINENT RADIOLOGIC STUDIES: Ct Angio Head W/cm &/or Wo Cm  05/16/2015  CLINICAL DATA:  RIGHT leg weakness beginning today. Follow-up frontal lobe infarct. History of migraine. EXAM: CT ANGIOGRAPHY HEAD AND NECK TECHNIQUE: Multidetector CT imaging of the head and neck was performed using the standard protocol during bolus administration of intravenous contrast. Multiplanar CT image reconstructions and MIPs were obtained to evaluate the vascular anatomy. Carotid stenosis measurements (when applicable) are  obtained utilizing NASCET criteria, using the distal internal carotid diameter as the denominator. CONTRAST:  80mL OMNIPAQUE IOHEXOL 350 MG/ML SOLN COMPARISON:  MRI of the brain May 16, 2015 at 1859 hours. FINDINGS: CTA NECK Aortic arch: Normal appearance of the thoracic arch, normal branch pattern. Two vessel aortic arch. The origins of the innominate, left Common carotid artery and subclavian artery are widely patent. Right carotid system: Common carotid artery is widely patent, coursing in a straight line fashion. Normal appearance of the carotid bifurcation without hemodynamically significant stenosis by NASCET criteria. Normal appearance of the included internal carotid artery. Left carotid system: Common carotid artery is widely patent, coursing in a straight line fashion. Normal appearance of the carotid bifurcation without hemodynamically significant stenosis by NASCET criteria. Normal appearance of the included internal carotid artery. Vertebral arteries:Left vertebral artery is dominant. Normal appearance of the vertebral arteries, which appear widely patent. Skeleton: No acute osseous process though bone windows have not been submitted. Other neck: Soft tissues of the neck are nonacute though, not tailored for evaluation. Minimal calcific atherosclerosis of the carotid arteries. Calcified hilar lymph nodes. CTA HEAD Anterior circulation: Normal appearance of the cervical internal carotid arteries, petrous, cavernous and supra clinoid internal carotid arteries. Widely patent anterior communicating artery. Widely patent anterior and middle cerebral arteries. Very mild luminal regularity of the mid to distal middle cerebral arteries. Posterior circulation: Normal appearance of the vertebral arteries, vertebrobasilar junction and basilar artery, as well as main branch vessels. Normal appearance of the posterior cerebral arteries. No large vessel occlusion, hemodynamically significant stenosis,  dissection, luminal irregularity, contrast extravasation or aneurysm within the anterior nor posterior  circulation. Minimal luminal irregularity of the mid to distal segments. No abnormal intracranial enhancement on delayed phase. IMPRESSION: CT HEAD:  No abnormal intracranial enhancement. CTA NECK:  Normal. CTA HEAD: No acute large vessel occlusion or high-grade stenosis. Minimal luminal irregularity, which can be seen with atherosclerosis, possible vasculopathy. Electronically Signed   By: Awilda Metro M.D.   On: 05/16/2015 22:08   Ct Head Wo Contrast  05/16/2015  CLINICAL DATA:  RIGHT leg weakness. This occurred after driving a car for short distance. EXAM: CT HEAD WITHOUT CONTRAST TECHNIQUE: Contiguous axial images were obtained from the base of the skull through the vertex without intravenous contrast. COMPARISON:  None. FINDINGS: No evidence for acute infarction, hemorrhage, mass lesion, hydrocephalus, or extra-axial fluid. No atrophy or white matter disease. Intact calvarium. Negative orbits. Incompletely visualized LEFT maxillary sinus disease. IMPRESSION: No acute intracranial findings. No visible acute stroke, hemorrhage, or signs of large vessel occlusion. Cannot exclude acute LEFT maxillary sinus disease. Electronically Signed   By: Elsie Stain M.D.   On: 05/16/2015 14:17   Ct Angio Neck W/cm &/or Wo/cm  05/16/2015  CLINICAL DATA:  RIGHT leg weakness beginning today. Follow-up frontal lobe infarct. History of migraine. EXAM: CT ANGIOGRAPHY HEAD AND NECK TECHNIQUE: Multidetector CT imaging of the head and neck was performed using the standard protocol during bolus administration of intravenous contrast. Multiplanar CT image reconstructions and MIPs were obtained to evaluate the vascular anatomy. Carotid stenosis measurements (when applicable) are obtained utilizing NASCET criteria, using the distal internal carotid diameter as the denominator. CONTRAST:  80mL OMNIPAQUE IOHEXOL 350 MG/ML  SOLN COMPARISON:  MRI of the brain May 16, 2015 at 1859 hours. FINDINGS: CTA NECK Aortic arch: Normal appearance of the thoracic arch, normal branch pattern. Two vessel aortic arch. The origins of the innominate, left Common carotid artery and subclavian artery are widely patent. Right carotid system: Common carotid artery is widely patent, coursing in a straight line fashion. Normal appearance of the carotid bifurcation without hemodynamically significant stenosis by NASCET criteria. Normal appearance of the included internal carotid artery. Left carotid system: Common carotid artery is widely patent, coursing in a straight line fashion. Normal appearance of the carotid bifurcation without hemodynamically significant stenosis by NASCET criteria. Normal appearance of the included internal carotid artery. Vertebral arteries:Left vertebral artery is dominant. Normal appearance of the vertebral arteries, which appear widely patent. Skeleton: No acute osseous process though bone windows have not been submitted. Other neck: Soft tissues of the neck are nonacute though, not tailored for evaluation. Minimal calcific atherosclerosis of the carotid arteries. Calcified hilar lymph nodes. CTA HEAD Anterior circulation: Normal appearance of the cervical internal carotid arteries, petrous, cavernous and supra clinoid internal carotid arteries. Widely patent anterior communicating artery. Widely patent anterior and middle cerebral arteries. Very mild luminal regularity of the mid to distal middle cerebral arteries. Posterior circulation: Normal appearance of the vertebral arteries, vertebrobasilar junction and basilar artery, as well as main branch vessels. Normal appearance of the posterior cerebral arteries. No large vessel occlusion, hemodynamically significant stenosis, dissection, luminal irregularity, contrast extravasation or aneurysm within the anterior nor posterior circulation. Minimal luminal irregularity of the  mid to distal segments. No abnormal intracranial enhancement on delayed phase. IMPRESSION: CT HEAD:  No abnormal intracranial enhancement. CTA NECK:  Normal. CTA HEAD: No acute large vessel occlusion or high-grade stenosis. Minimal luminal irregularity, which can be seen with atherosclerosis, possible vasculopathy. Electronically Signed   By: Awilda Metro M.D.   On: 05/16/2015 22:08  Mr Brain Wo Contrast  05/16/2015  CLINICAL DATA:  44 year old female with right lower extremity numbness and weakness of acute onset. Initial encounter. EXAM: MRI HEAD WITHOUT CONTRAST TECHNIQUE: Multiplanar, multiecho pulse sequences of the brain and surrounding structures were obtained without intravenous contrast. COMPARISON:  Head CT without contrast 1351 hours today. FINDINGS: Punctate focus of increased trace diffusion in the left posterior MCA white matter near the peri-Rolandic cortex (series 4, image 41) does appear restricted on the ADC map (series 400, image 40) but is not well correlated on coronal diffusion images. No associated T2 or FLAIR hyperintensity. Major intracranial vascular flow voids are within normal limits. No other restricted diffusion. No midline shift, mass effect, evidence of mass lesion, ventriculomegaly, extra-axial collection or acute intracranial hemorrhage. Cervicomedullary junction and pituitary are within normal limits. Negative visualized cervical spine. Normal gray and white matter signal on non diffusion sequences. No chronic cerebral blood products or encephalomalacia identified. Visible internal auditory structures appear normal. Mastoids are clear. Fluid in bubbly opacity in the left maxillary sinus with mild paranasal sinus mucosal thickening elsewhere. Negative orbit and scalp soft tissues. IMPRESSION: 1. Punctate acute lacunar infarct in the subcortical white matter of the posterior left frontal lobe near the peri-Rolandic cortex (series 4, image 41). No associated hemorrhage or  mass effect. 2. Otherwise negative noncontrast MRI appearance of the brain. 3. Left maxillary sinus inflammation. Electronically Signed   By: Odessa FlemingH  Hall M.D.   On: 05/16/2015 19:23   Mm Digital Screening Bilateral  05/02/2015  CLINICAL DATA:  Screening. EXAM: DIGITAL SCREENING BILATERAL MAMMOGRAM WITH CAD COMPARISON:  Previous exam(s). ACR Breast Density Category c: The breast tissue is heterogeneously dense, which may obscure small masses. FINDINGS: There are no findings suspicious for malignancy. Images were processed with CAD. IMPRESSION: No mammographic evidence of malignancy. A result letter of this screening mammogram will be mailed directly to the patient. RECOMMENDATION: Screening mammogram in one year. (Code:SM-B-01Y) BI-RADS CATEGORY  1: Negative. Electronically Signed   By: Amie Portlandavid  Ormond M.D.   On: 05/02/2015 10:39     PERTINENT LAB RESULTS: CBC:  Recent Labs  05/16/15 1349  WBC 6.9  HGB 13.9  HCT 43.2  PLT 276   CMET CMP     Component Value Date/Time   NA 138 05/16/2015 1349   K 3.7 05/16/2015 1349   CL 108 05/16/2015 1349   CO2 23 05/16/2015 1349   GLUCOSE 96 05/16/2015 1349   BUN 18 05/16/2015 1349   CREATININE 0.64 05/16/2015 1349   CREATININE 0.59 04/30/2015 0915   CALCIUM 9.0 05/16/2015 1349   PROT 7.1 04/30/2015 0915   ALBUMIN 3.9 04/30/2015 0915   AST 14 04/30/2015 0915   ALT 10 04/30/2015 0915   ALKPHOS 86 04/30/2015 0915   BILITOT 0.4 04/30/2015 0915   GFRNONAA >60 05/16/2015 1349   GFRNONAA >89 04/30/2015 0915   GFRAA >60 05/16/2015 1349   GFRAA >89 04/30/2015 0915    GFR Estimated Creatinine Clearance: 99.9 mL/min (by C-G formula based on Cr of 0.64). No results for input(s): LIPASE, AMYLASE in the last 72 hours. No results for input(s): CKTOTAL, CKMB, CKMBINDEX, TROPONINI in the last 72 hours. Invalid input(s): POCBNP No results for input(s): DDIMER in the last 72 hours.  Recent Labs  05/17/15 0630  HGBA1C 5.3    Recent Labs   05/17/15 0630  CHOL 201*  HDL 59  LDLCALC 123*  TRIG 94  CHOLHDL 3.4    Recent Labs  05/17/15 0630  TSH 0.759  No results for input(s): VITAMINB12, FOLATE, FERRITIN, TIBC, IRON, RETICCTPCT in the last 72 hours. Coags:  Recent Labs  05/16/15 1349  INR 0.93   Microbiology: No results found for this or any previous visit (from the past 240 hour(s)).   BRIEF HOSPITAL COURSE:  Acute CVA (cerebral infarction): MRI brain shows a small left frontal subcortical infarct, per neurology could have a functional component as clinical findings seem disproportionate to MRI findings. CTA head and neck unremarkable for major stenosis. 2-D echo negative for embolic source, transcranial bubble study was negative as well. ypercoagulable/autoimmune workup pending as well. LDL elevated at 123 (goal <16) .Continue aspirin and statin. continues to have some mild right-sided weakness-better than yesterday. Exam continues to be somewhat inconsistent as well. No further recommendations from neurology, spoke with Dr. Shanda Bumps to discharge today. Suggest outpatient psychiatry evaluation. Please follow autoimmune/hypercoagulable workup which is pending.   Active Problems: OBESITY: Counseled regarding importance of weight loss  History of migraine with aura: Currently headache free.  16 weeks post partum-not breast-feeding   TODAY-DAY OF DISCHARGE:  Subjective:   Gethsemane Fischler today has no headache,no chest abdominal pain,no new weakness tingling or numbness, feels much better wants to go home today.   Objective:   Blood pressure 109/71, pulse 65, temperature 98.4 F (36.9 C), temperature source Oral, resp. rate 20, height  (1.626 m), weight 92.534 kg (204 lb), last menstrual period 05/14/2015, SpO2 100 %.  Intake/Output Summary (Last 24 hours) at 05/18/15 1122 Last data filed at 05/17/15 2200  Gross per 24 hour  Intake    240 ml  Output      0 ml  Net    240 ml   Filed Weights   05/16/15  2205  Weight: 92.534 kg (204 lb)    Exam Awake Alert, Oriented *3, No new F.N deficits, Normal affect [place].AT,PERRAL Supple Neck,No JVD, No cervical lymphadenopathy appriciated.  Symmetrical Chest wall movement, Good air movement bilaterally, CTAB RRR,No Gallops,Rubs or new Murmurs, No Parasternal Heave +ve B.Sounds, Abd Soft, Non tender, No organomegaly appriciated, No rebound -guarding or rigidity. No Cyanosis, Clubbing or edema, No new Rash or bruise  DISCHARGE CONDITION: Stable  DISPOSITION: Home with home health services  DISCHARGE INSTRUCTIONS:    Activity:  As tolerated with Full fall precautions use walker/cane & assistance as needed  Get Medicines reviewed and adjusted: Please take all your medications with you for your next visit with your Primary MD  Please request your Primary MD to go over all hospital tests and procedure/radiological results at the follow up, please ask your Primary MD to get all Hospital records sent to his/her office.  If you experience worsening of your admission symptoms, develop shortness of breath, life threatening emergency, suicidal or homicidal thoughts you must seek medical attention immediately by calling 911 or calling your MD immediately  if symptoms less severe.  You must read complete instructions/literature along with all the possible adverse reactions/side effects for all the Medicines you take and that have been prescribed to you. Take any new Medicines after you have completely understood and accpet all the possible adverse reactions/side effects.   Do not drive when taking Pain medications.   Do not take more than prescribed Pain, Sleep and Anxiety Medications  Special Instructions: If you have smoked or chewed Tobacco  in the last 2 yrs please stop smoking, stop any regular Alcohol  and or any Recreational drug use.  Wear Seat belts while driving.  Please note  You  were cared for by a hospitalist during your hospital stay.  Once you are discharged, your primary care physician will handle any further medical issues. Please note that NO REFILLS for any discharge medications will be authorized once you are discharged, as it is imperative that you return to your primary care physician (or establish a relationship with a primary care physician if you do not have one) for your aftercare needs so that they can reassess your need for medications and monitor your lab values.   Diet recommendation: Heart Healthy diet   Discharge Instructions    Ambulatory referral to Neurology    Complete by:  As directed      Call MD for:  extreme fatigue    Complete by:  As directed      Call MD for:  persistant dizziness or light-headedness    Complete by:  As directed      Diet - low sodium heart healthy    Complete by:  As directed      Increase activity slowly    Complete by:  As directed            Follow-up Information    Follow up with METHENEY,CATHERINE, MD. Schedule an appointment as soon as possible for a visit in 1 week.   Specialty:  Family Medicine   Why:  Hospital follow up   Contact information:   [address] [place] [address] [phone]       Follow up with Xu,Jindong, MD.   Specialty:  Neurology   Why:  Hospital follow up-office will call you with the follow-up appointment   Contact information:   [address] [place] [address] [phone]      Total Time spent on discharge equals 45 minutes.  SignedJeoffrey Massed 05/18/2015 11:22 AM

## 2015-05-18 NOTE — Care Management Note (Signed)
Case Management Note  Patient Details  Name: Brandy Brady MRN: [id number] Date of Birth: 1971-05-15  Subjective/Objective:                    Action/Plan: Met with patient to discuss discharge planning. Patient is agreeable to home health services.  Patient chose multiple home health agencies, which were unable to accommodate her needs due to staffing and her insurance policy. Patient has chosen to use [facility].  Stephanie with [facility] was notified and has accepted the referral for discharge home today. [facility] DME was notified of need for rolling walker prior to discharge home.  Expected Discharge Date:                  Expected Discharge Plan:  [place]  In-House Referral:     Discharge planning Services  CM Consult  Post Acute Care Choice:  Durable Medical Equipment, Home Health Choice offered to:  Patient  DME Arranged:  Walker rolling DME Agency:  [place] Arranged:  PT, OT [facility] Agency:  [place]  Status of Service:  Completed, signed off  Medicare Important Message Given:    Date Medicare IM Given:    Medicare IM give by:    Date Additional Medicare IM Given:    Additional Medicare Important Message give by:     If discussed at [place] of Stay Meetings, dates discussed:    Additional CommentsRolm Baptise, RN 05/18/2015, 11:41 AM [phone]

## 2015-05-19 LAB — BETA-2-GLYCOPROTEIN I ABS, IGG/M/A
Beta-2 Glyco I IgG: <9 GPI IgG units (ref 0–20)
Beta-2-Glycoprotein I IgA: <9 GPI IgA units (ref 0–25)
Beta-2-Glycoprotein I IgM: <9 GPI IgM units (ref 0–32)

## 2015-05-22 LAB — PROTHROMBIN GENE MUTATION

## 2015-05-22 LAB — PROTEIN S ACTIVITY: Protein S Activity: 95 % (ref 63–140)

## 2015-05-22 LAB — PROTEIN S, TOTAL: Protein S Ag, Total: 105 % (ref 60–150)

## 2015-05-22 LAB — PROTEIN C ACTIVITY: Protein C Activity: 121 % (ref 73–180)

## 2015-05-22 LAB — FACTOR 5 LEIDEN

## 2015-05-23 LAB — LUPUS ANTICOAGULANT PANEL
DRVVT: 36.4 s (ref 0.0–44.0)
PTT Lupus Anticoagulant: 39.2 s (ref 0.0–40.6)

## 2015-05-24 ENCOUNTER — Ambulatory Visit (INDEPENDENT_AMBULATORY_CARE_PROVIDER_SITE_OTHER): Payer: BLUE CROSS/BLUE SHIELD | Admitting: Family Medicine

## 2015-05-24 ENCOUNTER — Encounter: Payer: Self-pay | Admitting: Family Medicine

## 2015-05-24 VITALS — BP 113/70 | HR 72 | Ht 64.0 in | Wt 207.0 lb

## 2015-05-24 DIAGNOSIS — I639 Cerebral infarction, unspecified: Secondary | ICD-10-CM | POA: Diagnosis not present

## 2015-05-24 DIAGNOSIS — E785 Hyperlipidemia, unspecified: Secondary | ICD-10-CM

## 2015-05-24 DIAGNOSIS — G43109 Migraine with aura, not intractable, without status migrainosus: Secondary | ICD-10-CM | POA: Diagnosis not present

## 2015-05-24 LAB — ALPHA GALACTOSIDASE: ALPHA GALACTOSIDASE, SERUM: 71.1 nmol/h/mg{prot} (ref 28.0–80.0)

## 2015-05-24 NOTE — Progress Notes (Signed)
   Subjective:    Patient ID: Brandy Brady, female    DOB: 1971/02/02, 45 y.o.   MRN: [id number]  HPI 45 year old female comes in today for hospital follow-up. She was admitted to the hospital on December 28 for right lower extremity weakness and stroke. She drove herself to the emergency department. She had a brain MRI which showed a small left frontal subcortical infarct. It was noted that this could have a functional component but was not quite consistent with the exact symptoms that she experienced. CTA of head and neck was negative for major stenosis. She had a 2-D echo to rule out embolic source and she does have a history of migraines with aura. Bubble study was negative. Hypercoagulable panel was negative. Autoimmune workup was negative. Final results have come in over the last week. Her LDL was mildly elevated at 123 with a goal of less than 70 with 39 Diagnosis of stroke. She is now currently taking a full dose aspirin and was started on atorvastatin. She seems some commercials on TV about the Lipitor and is concerned about potential side effects and would like to discuss that today.   Review of Systems     Objective:   Physical Exam  Constitutional: She is oriented to person, place, and time. She appears well-developed and well-nourished.  HENT:  Head: Normocephalic and atraumatic.  Right Ear: External ear normal.  Left Ear: External ear normal.  Nose: Nose normal.  Mouth/Throat: Oropharynx is clear and moist.  Eyes: Conjunctivae and EOM are normal. Pupils are equal, round, and reactive to light.  Neck: Neck supple. No thyromegaly present.  Cardiovascular: Normal rate, regular rhythm and normal heart sounds.   Pulmonary/Chest: Effort normal and breath sounds normal. She has no wheezes.  Lymphadenopathy:    She has no cervical adenopathy.  Neurological: She is alert and oriented to person, place, and time. She has normal reflexes. No cranial nerve deficit.  Alert and oriented.   CN 2-12 intact.  Normal coordination of fingers/hands. Reflexes symmetric in the UE and LE.  Normal knee to ankle, down the shin bilaterally.  No tremor.  Gait is normal. She had a little bit more trouble walking on her tiptoes but was able to walk forwards and backwards flat-footed without any difficulty.   Skin: Skin is warm and dry.  Psychiatric: She has a normal mood and affect.          Assessment & Plan:  Cerebral infarct-she is recovering well. In fact she was able to walk into the office without any difficulty today. She did bring a cane with her just to help with steadiness of gait but otherwise feels like her strength is almost back to normal in the right lower extremity. She did not suffer any speech impairment or facial droop etc. She feels like mentally she is doing well. She is taking her aspirin and atorvastatin. She'll be due for recheck on her lipids in 3 months I will see her back at that time. She says she has not been contacted for a neurology what, so we will get this scheduled for her.  Hyperlipidemia-recheck lipids in 3 months. Discussed the atorvastatin and potential side effects and discussed the importance of taking this medication to reduce future risk of stroke. Next  Migraine with aura-we did discuss that she is at higher risk of stroke over her lifetime. She has had her tubes tied so she is on birth control.

## 2015-06-01 ENCOUNTER — Telehealth: Payer: Self-pay | Admitting: Family Medicine

## 2015-06-01 NOTE — Telephone Encounter (Signed)
Called pt and advised of the dosage recommended by Dr. Linford ArnoldMetheney

## 2015-06-01 NOTE — Telephone Encounter (Signed)
Okay to take 400 mg ibuprofen as needed but do not take consecutively in for multiple days in a row. Mix with the aspirin this does put her at risk of bleeding and GI upset and irritation.

## 2015-06-01 NOTE — Telephone Encounter (Signed)
Pt called office, states due to her history of mild stoke she questions if it's OK to take Ibuprofen. Pt states she has been trying to take just Tylenol for pain and it is not helping. Will route to PCP for review.

## 2015-06-04 ENCOUNTER — Ambulatory Visit (INDEPENDENT_AMBULATORY_CARE_PROVIDER_SITE_OTHER): Payer: BLUE CROSS/BLUE SHIELD | Admitting: Diagnostic Neuroimaging

## 2015-06-04 ENCOUNTER — Encounter: Payer: Self-pay | Admitting: Diagnostic Neuroimaging

## 2015-06-04 VITALS — BP 112/78 | HR 73 | Ht 64.0 in | Wt 206.8 lb

## 2015-06-04 DIAGNOSIS — G43109 Migraine with aura, not intractable, without status migrainosus: Secondary | ICD-10-CM

## 2015-06-04 DIAGNOSIS — I6389 Other cerebral infarction: Secondary | ICD-10-CM

## 2015-06-04 DIAGNOSIS — I638 Other cerebral infarction: Secondary | ICD-10-CM

## 2015-06-04 MED ORDER — ATORVASTATIN CALCIUM 20 MG PO TABS: 20.0000 mg | ORAL_TABLET | Freq: Every day | ORAL | Status: DC

## 2015-06-04 NOTE — Progress Notes (Signed)
[facility]  PATIENT: Brandy Brady DOB: [birth year]  REFERRING CLINICIAN:  HISTORY FROM: patient REASON FOR VISIT: new consult (hospital discharge)   HISTORICAL  CHIEF COMPLAINT:  Chief Complaint  Patient presents with  . Cerebrovascular Accident    rm 6, New Patient, CVA 05/16/15, "R leg weakness"    HISTORY OF PRESENT ILLNESS:   45 year old female here for hospital follow-up, consultation, for small left frontal juxtacortical ischemic infarction. Patient was 16 weeks postpartum when this occurred. 05/16/15, patient was driving her car, and around noontime she tried getting out of the car. Her right leg felt heavy and she had some numbness in the right arm. Patient came to the emergency room for evaluation. She is found to have a punctate left brain ischemic infarction. Stroke workup was completed including CT of the head and neck, echocardiogram, transcranial Doppler bubble study, hypercoagulable workup. Stroke team and neurology consultation were obtained. Some functional overlay and inconsistent exam findings were noted.  Since discharge, doing better. Home PT has released her from their service. Patient is taking aspirin and atorvastatin.   REVIEW OF SYSTEMS: Full 14 system review of systems performed and notable only for decreased energy headache. History of migraine.  ALLERGIES: No Known Allergies  HOME MEDICATIONS: Outpatient Prescriptions Prior to Visit  Medication Sig Dispense Refill  . aspirin 325 MG tablet Take 1 tablet (325 mg total) by mouth daily. 30 tablet 0  . atorvastatin (LIPITOR) 20 MG tablet Take 1 tablet (20 mg total) by mouth daily at 6 PM. 30 tablet 0   No facility-administered medications prior to visit.    PAST MEDICAL HISTORY: Past Medical History  Diagnosis Date  . GERD (gastroesophageal reflux disease)   . Obesity   . Migraine   . Stroke [facility]) 05/16/15    PAST SURGICAL HISTORY: Past Surgical History  Procedure  Laterality Date  . Cholecystectomy    . Tubal ligation  01/2015  . Cesarean section  01/19/2015    FAMILY HISTORY: Family History  Problem Relation Age of Onset  . Cancer - Other Father     liver  . Diabetes Father   . Cancer - Lung Mother   . Cancer - Other      SOCIAL HISTORY:  Social History   Social History  . Marital Status: Married    Spouse Name: Shon  . Number of Children: 5  . Years of Education: B.S.   Occupational History  . customer service     Claudine Mouton, homemaker now 05/2015   Social History Main Topics  . Smoking status: Never Smoker   . Smokeless tobacco: Not on file  . Alcohol Use: 0.5 oz/week    1 Standard drinks or equivalent per week     Comment: socially, none recently  . Drug Use: No  . Sexual Activity:    Partners: Male    Birth Control/ Protection: Pill, Surgical   Other Topics Concern  . Not on file   Social History Narrative   She works as a Radiation protection practitioner for [employer]. She does Zumba 2x a week for 1 hour .Teddy Spike      Lives at home with spouse, children   Caffeine use- soda rarely     PHYSICAL EXAM  GENERAL EXAM/CONSTITUTIONAL: Vitals:  Filed Vitals:   06/04/15 0853  BP: 112/78  Pulse: 73  Height: 5' 4" (1.626 m)  Weight: 206 lb 12.8 oz (93.804 kg)     Body mass index is 35.48  kg/(m^2).  Visual Acuity Screening   Right eye Left eye Both eyes  Without correction:     With correction: 20/20 20/20      Patient is in no distress; well developed, nourished and groomed; neck is supple  CARDIOVASCULAR:  Examination of carotid arteries is normal; no carotid bruits  Regular rate and rhythm, no murmurs  Examination of peripheral vascular system by observation and palpation is normal  EYES:  Ophthalmoscopic exam of optic discs and posterior segments is normal; no papilledema or hemorrhages  MUSCULOSKELETAL:  Gait, strength, tone, movements noted in Neurologic exam  below  NEUROLOGIC: MENTAL STATUS:  No flowsheet data found.  awake, alert, oriented to person, place and time  recent and remote memory intact  normal attention and concentration  language fluent, comprehension intact, naming intact,   fund of knowledge appropriate  CRANIAL NERVE:   2nd - no papilledema on fundoscopic exam  2nd, 3rd, 4th, 6th - pupils equal and reactive to light, visual fields full to confrontation, extraocular muscles intact, no nystagmus  5th - facial sensation symmetric  7th - facial strength symmetric  8th - hearing intact  9th - palate elevates symmetrically, uvula midline  11th - shoulder shrug symmetric  12th - tongue protrusion midline  MOTOR:   normal bulk and tone, full strength in the BUE, BLE  SENSORY:   normal and symmetric to light touch, temperature, vibration   COORDINATION:   finger-nose-finger, fine finger movements normal  REFLEXES:   deep tendon reflexes present and symmetric  GAIT/STATION:   narrow based gait    DIAGNOSTIC DATA (LABS, IMAGING, TESTING) - I reviewed patient records, labs, notes, testing and imaging myself where available.  Lab Results  Component Value Date   WBC 6.9 05/16/2015   HGB 13.9 05/16/2015   HCT 43.2 05/16/2015   MCV 87.1 05/16/2015   PLT 276 05/16/2015      Component Value Date/Time   NA 138 05/16/2015 1349   K 3.7 05/16/2015 1349   CL 108 05/16/2015 1349   CO2 23 05/16/2015 1349   GLUCOSE 96 05/16/2015 1349   BUN 18 05/16/2015 1349   CREATININE 0.64 05/16/2015 1349   CREATININE 0.59 04/30/2015 0915   CALCIUM 9.0 05/16/2015 1349   PROT 7.1 04/30/2015 0915   ALBUMIN 3.9 04/30/2015 0915   AST 14 04/30/2015 0915   ALT 10 04/30/2015 0915   ALKPHOS 86 04/30/2015 0915   BILITOT 0.4 04/30/2015 0915   GFRNONAA >60 05/16/2015 1349   GFRNONAA >89 04/30/2015 0915   GFRAA >60 05/16/2015 1349   GFRAA >89 04/30/2015 0915   Lab Results  Component Value Date   CHOL 201*  05/17/2015   HDL 59 05/17/2015   LDLCALC 123* 05/17/2015   TRIG 94 05/17/2015   CHOLHDL 3.4 05/17/2015   Lab Results  Component Value Date   HGBA1C 5.3 05/17/2015   Lab Results  Component Value Date   FSELTRVU02 334 10/20/2013   Lab Results  Component Value Date   TSH 0.759 05/17/2015   05/16/15 MRI brain [I reviewed images myself and agree with interpretation. -VRP]  1. Punctate acute lacunar infarct in the subcortical white matter of the posterior left frontal lobe near the peri-Rolandic cortex (series 4, image 41). No associated hemorrhage or mass effect.  2. Otherwise negative noncontrast MRI appearance of the brain.  3. Left maxillary sinus inflammation.  05/16/15 CT HEAD: No abnormal intracranial enhancement.  05/16/15 CTA NECK: Normal.  05/16/15 CTA HEAD: No acute large vessel occlusion  or high-grade stenosis. Minimal luminal irregularity, which can be seen with atherosclerosis, possible vasculopathy.   05/17/15 TTE  - Left ventricle: The cavity size was normal. Systolic function was normal. The estimated ejection fraction was in the range of 55% to 60%. Wall motion was normal; there were no regional wall motion abnormalities. - Left atrium: The atrium was mildly dilated. - No cardiac source of emboli was indentified.  05/17/15 TCD bubble - Transcranial bubble study has been completed.  Dr. Erlinda Hong performed procedure. Risks/ benefits were explained and  verbal consent was taken. The Right middle cerebral artery was  insonated. IV in the right hand was used. 0 HITS heard at rest. 0  HITS heard during valsalva. No apparent PFO. - Negative TCD Bubble study without evidence of any right to left  intracardiac shunt.  05/17/15 Hypercoag workup: SSA, SSB, antiDSDNA, RF, ANCA, ESR, CRP, AT3, prot C and S, lupus AC, B2GP, homocysteine, factor 5, PTG20210A, cardiolipin, ANA, HIV, RPR --> ALL NEGATIVE    ASSESSMENT AND PLAN  45 y.o. year old female here with  cryptogenic stroke in the left frontal perirolandic region. Contributing factors could include history of migraine and postpartum state.  Dx:  Cerebrovascular accident (CVA) due to other mechanism ([place])  Migraine with aura and without status migrainosus, not intractable    PLAN: - continue aspirin and statin - long term mgmt of risk factors per Dr. Madilyn Fireman - check sleep study for stroke risk factor evaluation  Orders Placed This Encounter  Procedures  . Ambulatory referral to Sleep Studies   Meds ordered this encounter  Medications  . atorvastatin (LIPITOR) 20 MG tablet    Sig: Take 1 tablet (20 mg total) by mouth daily at 6 PM.    Dispense:  30 tablet    Refill:  6   Return in about 4 months (around 10/02/2015) for with Dr. Erlinda Hong in stroke clinic.    Penni Bombard, MD 09/19/5463, 6:81 AM Certified in Neurology, Neurophysiology and Neuroimaging  [facility] [address] [place], [place] [address] [phone]

## 2015-06-04 NOTE — Patient Instructions (Signed)
Thank you for coming to see [place] at [facility]. I hope we have been able to provide you high quality care today.  You may receive a patient satisfaction survey over the next few weeks. We would appreciate your feedback and comments so that we may continue to improve ourselves and the health of our patients.  - continue aspirin and atorvastatin - I will check sleep study   ~~~~~~~~~~~~~~~~~~~~~~~~~~~~~~~~~~~~~~~~~~~~~~~~~~~~~~~~~~~~~~~~~  DR. PENUMALLI'S GUIDE TO HAPPY AND HEALTHY LIVING These are some of my general health and wellness recommendations. Some of them may apply to you better than others. Please use common sense as you try these suggestions and feel free to ask me any questions.   ACTIVITY/FITNESS Mental, social, emotional and physical stimulation are very important for brain and body health. Try learning a new activity (arts, music, language, sports, games).  Keep moving your body to the best of your abilities. You can do this at home, inside or outside, the park, community center, gym or anywhere you like. Consider a physical therapist or personal trainer to get started. Consider the app Sworkit. Fitness trackers such as smart-watches, smart-phones or Fitbits can help as well.   NUTRITION Eat more plants: colorful vegetables, nuts, seeds and berries.  Eat less sugar, salt, preservatives and processed foods.  Avoid toxins such as cigarettes and alcohol.  Drink water when you are thirsty. Warm water with a slice of lemon is an excellent morning drink to start the day.  Consider these websites for more information The Nutrition Source (https://www.henry-hernandez.biz/) Precision Nutrition (WindowBlog.ch)   RELAXATION Consider practicing mindfulness meditation or other relaxation techniques such as deep breathing, prayer, yoga, tai chi, massage. See website mindful.org or the apps Headspace or Calm to help get  started.   SLEEP Try to get at least 7-8+ hours sleep per day. Regular exercise and reduced caffeine will help you sleep better. Practice good sleep hygeine techniques. See website sleep.org for more information.   PLANNING Prepare estate planning, living will, healthcare POA documents. Sometimes this is best planned with the help of an attorney. Theconversationproject.org and agingwithdignity.org are excellent resources.

## 2015-06-12 ENCOUNTER — Telehealth: Payer: Self-pay

## 2015-06-12 NOTE — Telephone Encounter (Signed)
LM for patient to call back to reschedule appt. Dr. Frances Furbish will be out sick. She can be rescheduled with Dr. Frances Furbish or Dr. Vickey Huger.

## 2015-06-13 ENCOUNTER — Institutional Professional Consult (permissible substitution): Payer: BLUE CROSS/BLUE SHIELD | Admitting: Neurology

## 2015-06-28 ENCOUNTER — Institutional Professional Consult (permissible substitution): Payer: BLUE CROSS/BLUE SHIELD | Admitting: Neurology

## 2015-07-10 ENCOUNTER — Institutional Professional Consult (permissible substitution): Payer: BLUE CROSS/BLUE SHIELD | Admitting: Neurology

## 2015-08-08 ENCOUNTER — Institutional Professional Consult (permissible substitution): Payer: BLUE CROSS/BLUE SHIELD | Admitting: Neurology

## 2015-08-08 ENCOUNTER — Telehealth: Payer: Self-pay

## 2015-08-08 NOTE — Telephone Encounter (Signed)
Patient did not show to appt today  

## 2015-08-15 ENCOUNTER — Encounter: Payer: Self-pay | Admitting: Neurology

## 2015-08-22 ENCOUNTER — Ambulatory Visit: Payer: BLUE CROSS/BLUE SHIELD | Admitting: Family Medicine

## 2015-08-28 ENCOUNTER — Ambulatory Visit (INDEPENDENT_AMBULATORY_CARE_PROVIDER_SITE_OTHER): Payer: BLUE CROSS/BLUE SHIELD | Admitting: Family Medicine

## 2015-08-28 DIAGNOSIS — E785 Hyperlipidemia, unspecified: Secondary | ICD-10-CM

## 2015-08-28 NOTE — Progress Notes (Signed)
Patient has been deemed a "no-show" for today's scheduled appointment.  Based on chief complaint and my chart review:  -Follow-up advised.  Contacting patient and urged to schedule visit in 2-3 weeks.  If necessary this was communicated to the patient via phone or mail on: 1:29 PM 08/28/2015

## 2015-09-18 ENCOUNTER — Ambulatory Visit: Payer: Self-pay | Admitting: Neurology

## 2015-09-19 ENCOUNTER — Encounter: Payer: Self-pay | Admitting: Neurology

## 2015-10-02 ENCOUNTER — Ambulatory Visit: Payer: BLUE CROSS/BLUE SHIELD | Admitting: Neurology

## 2016-03-18 ENCOUNTER — Telehealth: Payer: Self-pay

## 2016-03-18 MED ORDER — ATORVASTATIN CALCIUM 20 MG PO TABS: 20.0000 mg | ORAL_TABLET | Freq: Every day | ORAL | 0 refills | Status: DC

## 2016-03-18 NOTE — Telephone Encounter (Signed)
OK to fill for 90 day supply. Rx sent.

## 2016-03-18 NOTE — Telephone Encounter (Signed)
Brandy Brady called and would like a refill on Lipitor 20 mg once daily. I advised patient she did no show for her follow up hyperlipid appointment in April. She is due for a annual exam in December. Please advise. Never prescribed by Dr Linford ArnoldMetheney.

## 2016-03-19 NOTE — Telephone Encounter (Signed)
Patient advised.

## 2016-06-12 ENCOUNTER — Encounter: Payer: BLUE CROSS/BLUE SHIELD | Admitting: Family Medicine

## 2016-08-10 IMAGING — MG MM DIGITAL SCREENING BILAT W/ CAD
4 series · 4 of 4 positions shown · non-contrast
Comparison: Previous exam(s).

CLINICAL DATA: Screening.

EXAM:
DIGITAL SCREENING BILATERAL MAMMOGRAM WITH CAD

[R CC]
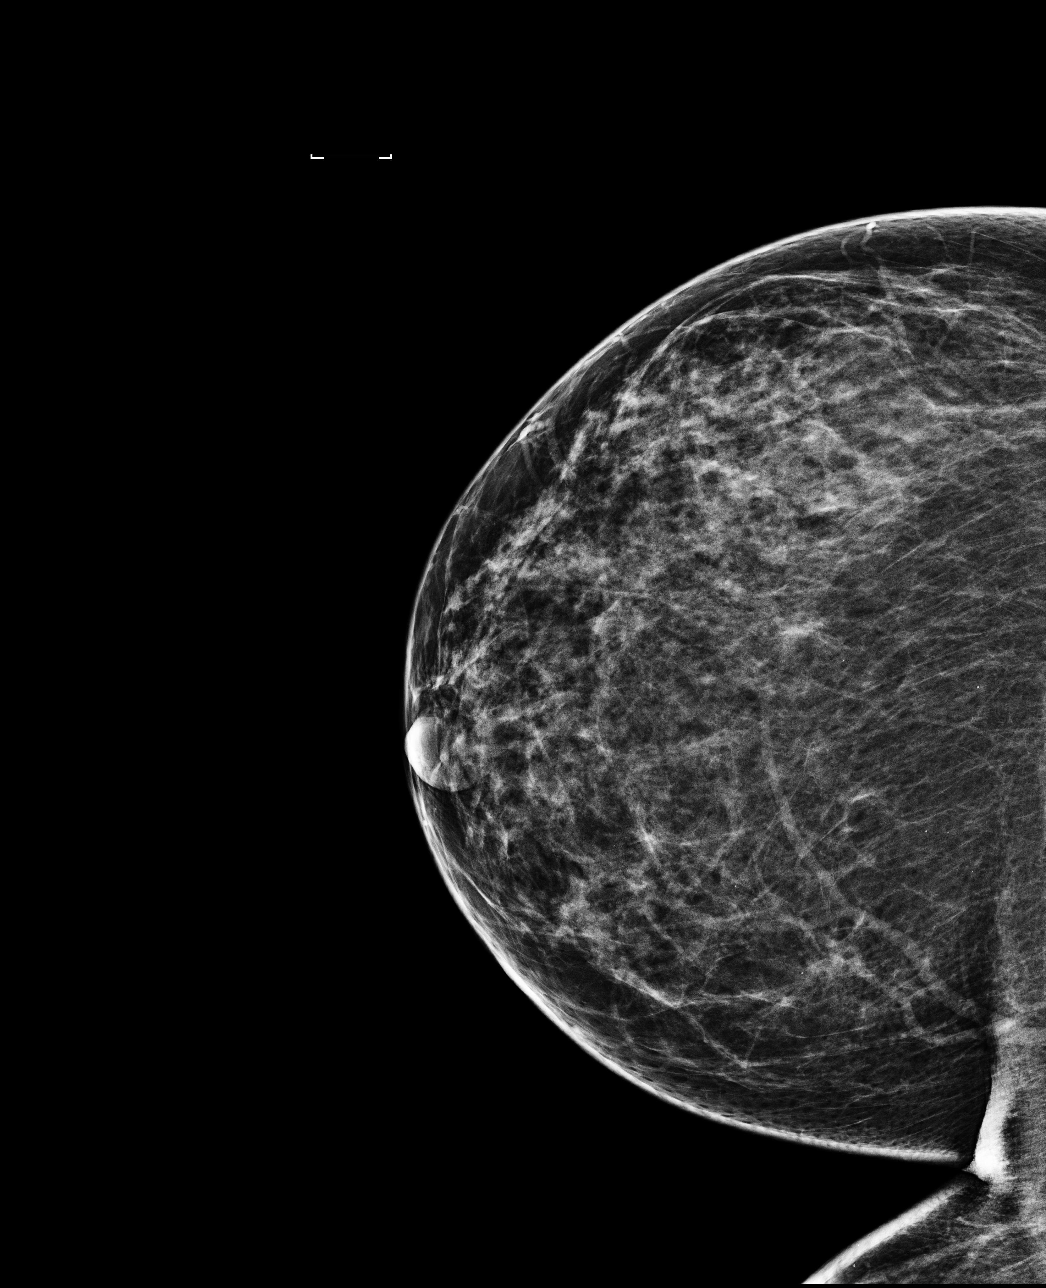

[L CC]
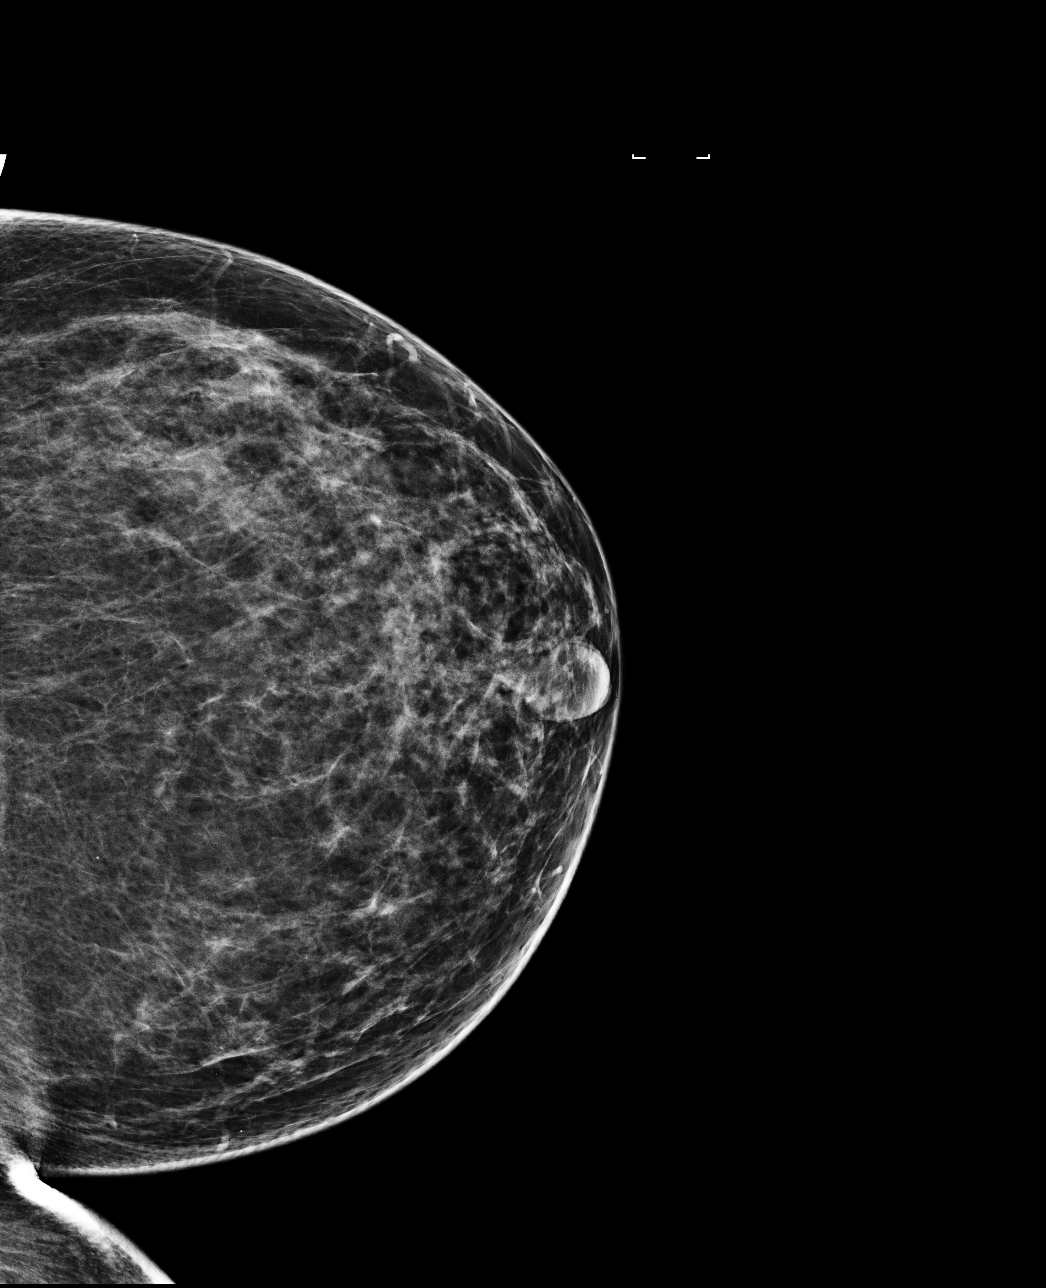

[R MLO]
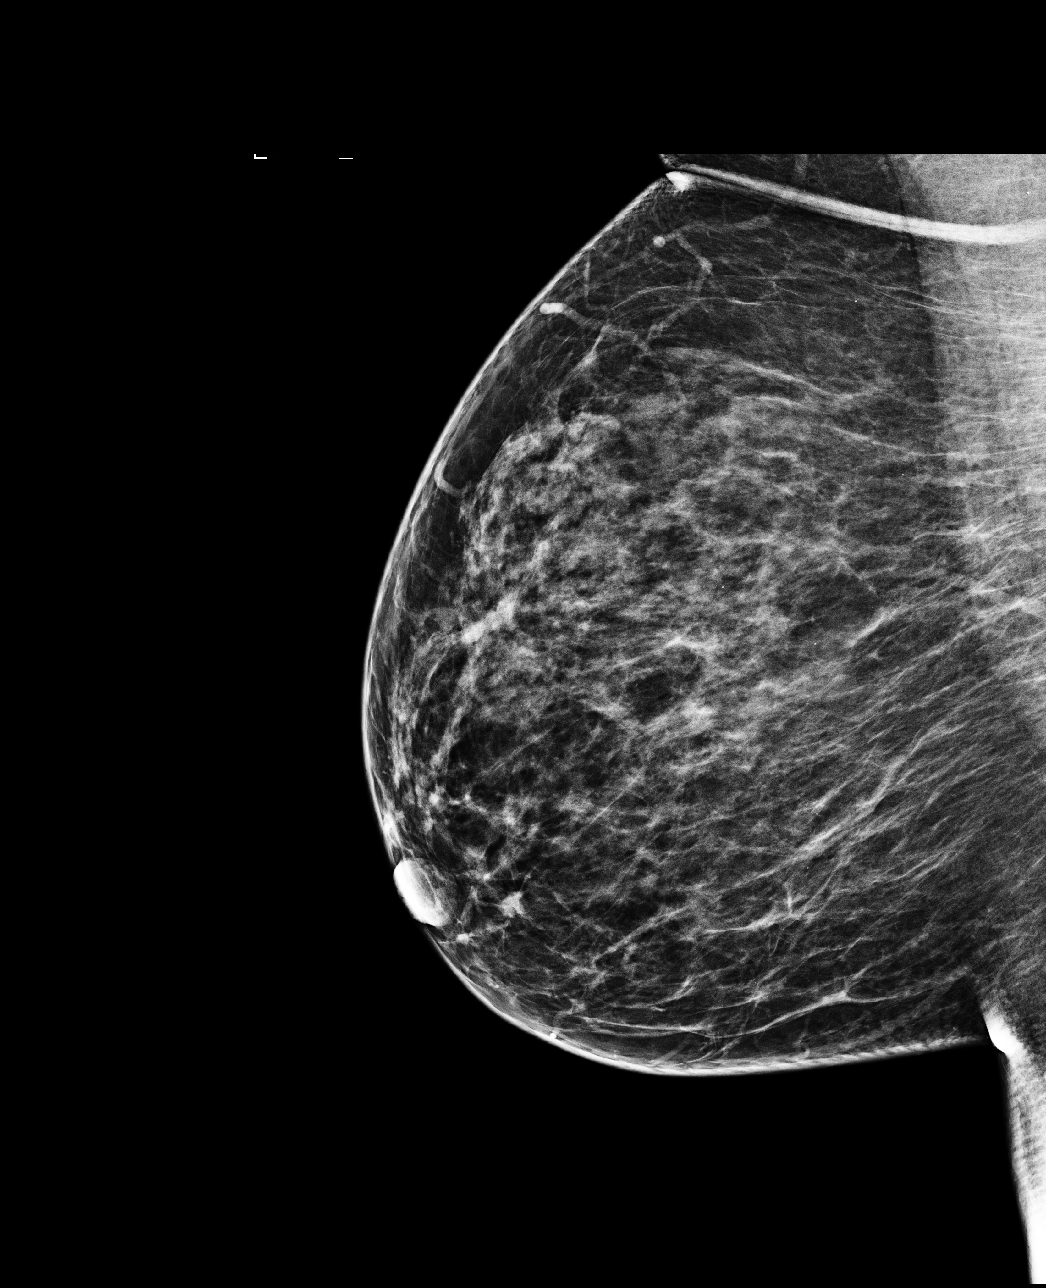

[L MLO]
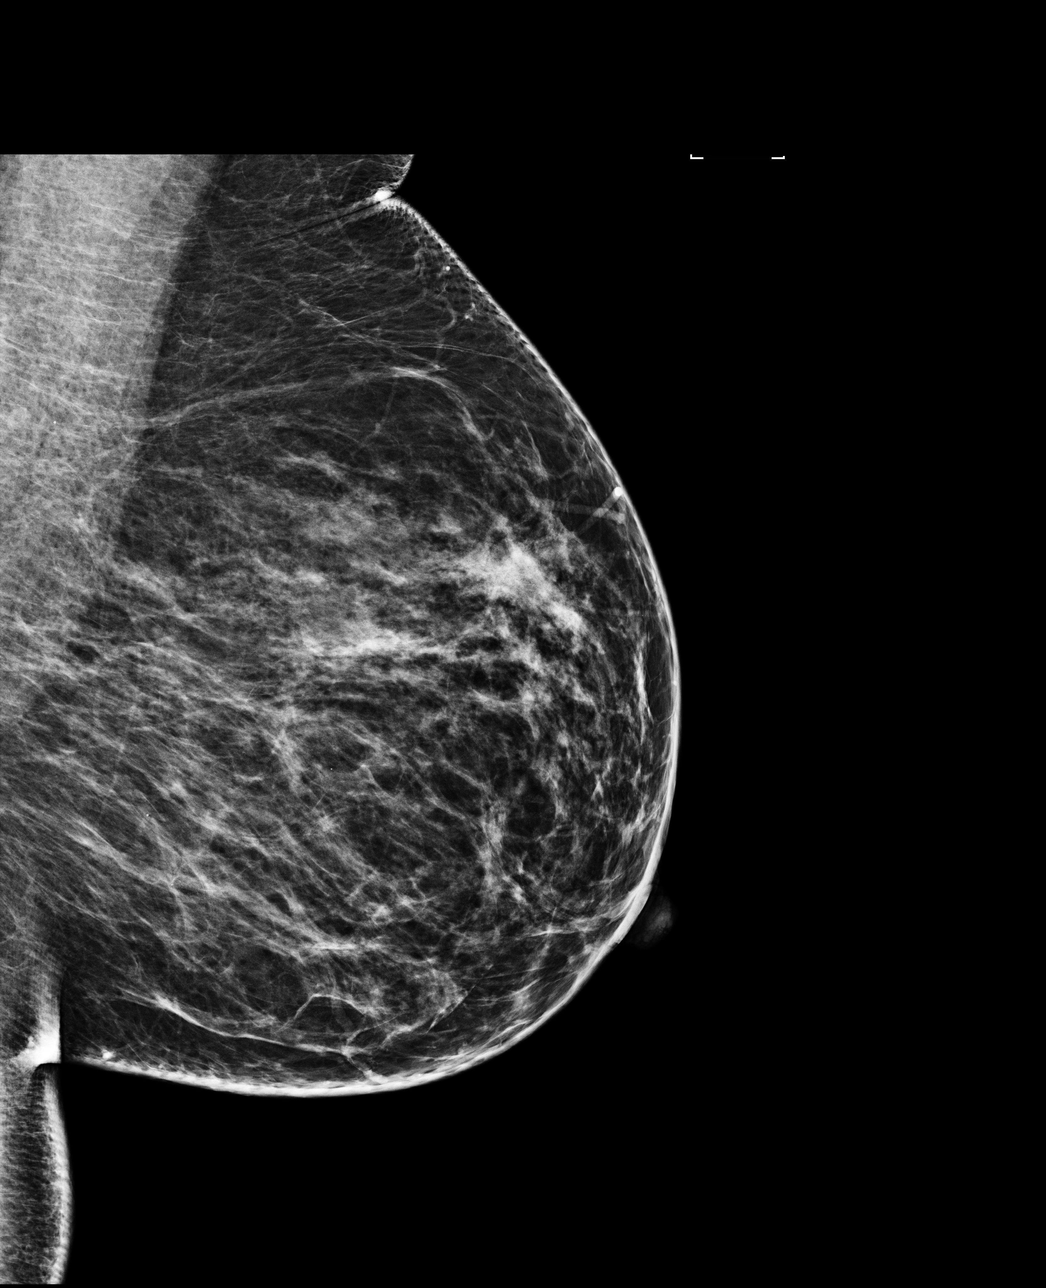

[4 of 4 positions shown; findings below may reference images not displayed]

ACR Breast Density Category c: The breast tissue is heterogeneously
dense, which may obscure small masses.
FINDINGS: There are no findings suspicious for malignancy. Images were
processed with CAD.
IMPRESSION: No mammographic evidence of malignancy. A result letter of this
screening mammogram will be mailed directly to the patient.

RECOMMENDATION:
Screening mammogram in one year. (Code:YJ-[id number])

BI-RADS CATEGORY  1: Negative.

## 2017-02-17 ENCOUNTER — Encounter: Payer: Self-pay | Admitting: Family Medicine

## 2017-02-17 ENCOUNTER — Ambulatory Visit (INDEPENDENT_AMBULATORY_CARE_PROVIDER_SITE_OTHER): Payer: BLUE CROSS/BLUE SHIELD | Admitting: Family Medicine

## 2017-02-17 VITALS — BP 124/70 | HR 59 | Ht 64.0 in | Wt 220.0 lb

## 2017-02-17 DIAGNOSIS — R0602 Shortness of breath: Secondary | ICD-10-CM | POA: Diagnosis not present

## 2017-02-17 DIAGNOSIS — Z1231 Encounter for screening mammogram for malignant neoplasm of breast: Secondary | ICD-10-CM

## 2017-02-17 DIAGNOSIS — Z0000 Encounter for general adult medical examination without abnormal findings: Secondary | ICD-10-CM | POA: Diagnosis not present

## 2017-02-17 LAB — CBC WITH DIFFERENTIAL/PLATELET
BASOS ABS: 27 {cells}/uL (ref 0–200)
BASOS PCT: 0.4 %
EOS ABS: 302 {cells}/uL (ref 15–500)
EOS PCT: 4.5 %
HCT: 41.1 % (ref 35.0–45.0)
Hemoglobin: 13.5 g/dL (ref 11.7–15.5)
Lymphs Abs: 2740 cells/uL (ref 850–3900)
MCH: 28.6 pg (ref 27.0–33.0)
MCHC: 32.8 g/dL (ref 32.0–36.0)
MCV: 87.1 fL (ref 80.0–100.0)
MONOS PCT: 7.7 %
MPV: 10.5 fL (ref 7.5–12.5)
Neutro Abs: 3116 cells/uL (ref 1500–7800)
Neutrophils Relative %: 46.5 %
PLATELETS: 307 10*3/uL (ref 140–400)
RBC: 4.72 10*6/uL (ref 3.80–5.10)
RDW: 12.5 % (ref 11.0–15.0)
TOTAL LYMPHOCYTE: 40.9 %
WBC mixed population: 516 cells/uL (ref 200–950)
WBC: 6.7 10*3/uL (ref 3.8–10.8)

## 2017-02-17 LAB — COMPLETE METABOLIC PANEL WITH GFR
AG RATIO: 1.5 (calc) (ref 1.0–2.5)
ALT: 12 U/L (ref 6–29)
AST: 13 U/L (ref 10–30)
Albumin: 4.1 g/dL (ref 3.6–5.1)
Alkaline phosphatase (APISO): 71 U/L (ref 33–115)
BILIRUBIN TOTAL: 0.5 mg/dL (ref 0.2–1.2)
BUN: 15 mg/dL (ref 7–25)
CHLORIDE: 106 mmol/L (ref 98–110)
CO2: 26 mmol/L (ref 20–32)
Calcium: 9.2 mg/dL (ref 8.6–10.2)
Creat: 0.84 mg/dL (ref 0.50–1.10)
GFR, EST AFRICAN AMERICAN: 98 mL/min/{1.73_m2} (ref >=60)
GFR, Est Non African American: 85 mL/min/{1.73_m2} (ref >=60)
Globulin: 2.7 g/dL (calc) (ref 1.9–3.7)
Glucose, Bld: 87 mg/dL (ref 65–99)
POTASSIUM: 4.7 mmol/L (ref 3.5–5.3)
Sodium: 139 mmol/L (ref 135–146)
Total Protein: 6.8 g/dL (ref 6.1–8.1)

## 2017-02-17 LAB — LIPID PANEL W/REFLEX DIRECT LDL
CHOL/HDL RATIO: 3 (calc) (ref <5.0)
Cholesterol: 181 mg/dL (ref <200)
HDL: 60 mg/dL (ref >50)
LDL Cholesterol (Calc): 105 mg/dL (calc) — ABNORMAL HIGH
NON-HDL CHOLESTEROL (CALC): 121 mg/dL (ref <130)
TRIGLYCERIDES: 70 mg/dL (ref <150)

## 2017-02-17 LAB — TSH: TSH: 0.72 mIU/L

## 2017-02-17 MED ORDER — ATORVASTATIN CALCIUM 20 MG PO TABS: 20.0000 mg | ORAL_TABLET | Freq: Every day | ORAL | 1 refills | Status: DC

## 2017-02-17 NOTE — Progress Notes (Signed)
Subjective:     Brandy Brady is a 46 y.o. female and is here for a comprehensive physical exam. The patient reports no problems.  SOB on and off. Says will feel like she is having a hard time taking a deep breath in at times. No wheezing.  No hx of pulmonary disease.     Social History   Social History  . Marital status: Married    Spouse name: Brandy Brady  . Number of children: 5  . Years of education: B.S.   Occupational History  . 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 Smoking status: Never Smoker  . Smokeless tobacco: Never Used  . Alcohol use 0.5 oz/week    1 Standard drinks or equivalent per week     Comment: socially, none recently  . Drug use: No  . Sexual activity: Yes    Partners: Male    Birth control/ protection: Pill, Surgical   Other Topics Concern  . Not on file   Social History Narrative   She works as a Occupational psychologist for [employer]. She does Zumba 2x a week for 1 hour .Deno Etienne      Lives at home with spouse, children   Caffeine use- soda rarely   Health Maintenance  Topic Date Due  . INFLUENZA VACCINE  12/18/2017 (Originally 12/17/2016)  . PAP SMEAR  06/19/2017  . TETANUS/TDAP  11/21/2024  . HIV Screening  Completed    The following portions of the patient's history were reviewed and updated as appropriate: allergies, current medications, past family history, past medical history, past social history, past surgical history and problem list.  Review of Systems A comprehensive review of systems was negative.   Objective:    BP 124/70   Pulse (!) 59   Ht  (1.626 m)   Wt 220 lb (99.8 kg)   SpO2 99%   BMI 37.76 kg/m  General appearance: alert, cooperative and appears stated age Head: Normocephalic, without obvious abnormality, atraumatic Eyes: conj claer, EOMI, PEERLA Ears: normal TM's and external ear canals both ears Nose: Nares normal. Septum midline. Mucosa  normal. No drainage or sinus tenderness. Throat: lips, mucosa, and tongue normal; teeth and gums normal Neck: no adenopathy, no carotid bruit, no JVD, supple, symmetrical, trachea midline and thyroid not enlarged, symmetric, no tenderness/mass/nodules Back: symmetric, no curvature. ROM normal. No CVA tenderness. Lungs: clear to auscultation bilaterally Breasts: normal appearance, no masses or tenderness Heart: regular rate and rhythm, S1, S2 normal, no murmur, click, rub or gallop Abdomen: soft, non-tender; bowel sounds normal; no masses,  no organomegaly Extremities: extremities normal, atraumatic, no cyanosis or edema Pulses: 2+ and symmetric Skin: Skin color, texture, turgor normal. No rashes or lesions Lymph nodes: Cervical, supraclavicular, and axillary nodes normal. Neurologic: Alert and oriented X 3, normal strength and tone. Normal symmetric reflexes. Normal coordination and gait    Assessment:    Healthy female exam.      Plan:     See After Visit Summary for Counseling Recommendations   Keep up a regular exercise program and make sure you are eating a healthy diet Try to eat 4 servings of dairy a day, or if you are lactose intolerant take a calcium with vitamin D daily.  Your vaccines are up to date.   SOB - will schedule spirometry.

## 2017-02-17 NOTE — Patient Instructions (Addendum)
Try to schedule your breathing test last spirometry in the next few weeks. Try to get back in with her neurologist ASAP.  Keep up a regular exercise program and make sure you are eating a healthy diet Try to eat 4 servings of dairy a day, or if you are lactose intolerant take a calcium with vitamin D daily.  Your vaccines are up to date.

## 2017-02-23 ENCOUNTER — Ambulatory Visit (HOSPITAL_BASED_OUTPATIENT_CLINIC_OR_DEPARTMENT_OTHER)
Admission: RE | Admit: 2017-02-23 | Discharge: 2017-02-23 | Disposition: A | Discharge status: Home / Self Care | Payer: BLUE CROSS/BLUE SHIELD | Source: Ambulatory Visit | Attending: Family Medicine | Admitting: Family Medicine

## 2017-02-23 DIAGNOSIS — Z1231 Encounter for screening mammogram for malignant neoplasm of breast: Secondary | ICD-10-CM

## 2017-03-03 ENCOUNTER — Encounter: Payer: Self-pay | Admitting: Family Medicine

## 2017-03-03 ENCOUNTER — Ambulatory Visit (INDEPENDENT_AMBULATORY_CARE_PROVIDER_SITE_OTHER): Payer: BLUE CROSS/BLUE SHIELD

## 2017-03-03 ENCOUNTER — Ambulatory Visit (INDEPENDENT_AMBULATORY_CARE_PROVIDER_SITE_OTHER): Payer: BLUE CROSS/BLUE SHIELD | Admitting: Family Medicine

## 2017-03-03 VITALS — BP 128/75 | HR 72

## 2017-03-03 DIAGNOSIS — R0602 Shortness of breath: Secondary | ICD-10-CM

## 2017-03-03 DIAGNOSIS — R079 Chest pain, unspecified: Secondary | ICD-10-CM | POA: Diagnosis not present

## 2017-03-03 DIAGNOSIS — I639 Cerebral infarction, unspecified: Secondary | ICD-10-CM | POA: Diagnosis not present

## 2017-03-03 DIAGNOSIS — M791 Myalgia, unspecified site: Secondary | ICD-10-CM | POA: Diagnosis not present

## 2017-03-03 LAB — PULMONARY FUNCTION TEST

## 2017-03-03 NOTE — Progress Notes (Signed)
Subjective:    Patient ID: Brandy Brady, female    DOB: 08-Jun-1970, 46 y.o.   MRN: [id number]  HPI 46 year old female comes in today to follow-up for shortness of breath x 27yrs. She was recently here for her wellness exam about 2 weeks ago and complained that time she was noticing she was having difficulty taking a deep breath. She had not noticed any wheezing and no prior history of pulmonary disease. I recommend that she come back in for spirometry.   Review of Systems  BP 128/75   Pulse 72   LMP 02/10/2017   SpO2 97%     No Known Allergies  Past Medical History:  Diagnosis Date  . GERD (gastroesophageal reflux disease)   . Migraine   . Obesity   . Stroke [facility]) 05/16/15    Past Surgical History:  Procedure Laterality Date  . CESAREAN SECTION  01/19/2015  . CHOLECYSTECTOMY    . TUBAL LIGATION  01/2015    Social History   Social History  . Marital status: Married    Spouse name: Brandy Brady  . Number of children: 5  . Years of education: B.S.   Occupational History  . 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 Smoking status: Never Smoker  . Smokeless tobacco: Never Used  . Alcohol use 0.5 oz/week    1 Standard drinks or equivalent per week     Comment: socially, none recently  . Drug use: No  . Sexual activity: Yes    Partners: Male    Birth control/ protection: Pill, Surgical   Other Topics Concern  . 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 Cancer - Other Father        liver  . Diabetes Father   . Cancer - Lung Mother   . Cancer - Other Unknown     Outpatient Encounter Prescriptions as of 03/03/2017  Medication Sig  . aspirin 325 MG tablet Take 1 tablet (325  mg total) by mouth daily.  Marland Kitchen atorvastatin (LIPITOR) 20 MG tablet Take 1 tablet (20 mg total) by mouth at bedtime.   No facility-administered encounter medications on file as of 03/03/2017.          Objective:   Physical Exam  Constitutional: She is oriented to person, place, and time. She appears well-developed and well-nourished.  HENT:  Head: Normocephalic and atraumatic.  Cardiovascular: Normal rate, regular rhythm and normal heart sounds.   Pulmonary/Chest: Effort normal and breath sounds normal.  Neurological: She is alert and oriented to person, place, and time.  Skin: Skin is warm and dry.  Psychiatric: She has a normal mood and affect. Her behavior is normal.       Assessment & Plan:  Shortest of breath-spirometry shows FVC of 99%, FEV1 of 75% with a ratio 62%. Brandy Brady had an 11% improvement in FEV1 post albuterol. Effort was fair in that her curves were very irregular. Will get CXR and the referral to pulmonologyFor further evaluation. I suspect that she probably has a better baseline sperm tree than what she was able to produce on exam today but she is  still experiencing difficulty feeling like she is getting a deep breath in. She said the section started about 4 years ago when she weighed much less than she does currently.Marland Kitchen

## 2017-03-04 LAB — CBC
HEMATOCRIT: 42 % (ref 35.0–45.0)
HEMOGLOBIN: 14.3 g/dL (ref 11.7–15.5)
MCH: 29.5 pg (ref 27.0–33.0)
MCHC: 34 g/dL (ref 32.0–36.0)
MCV: 86.8 fL (ref 80.0–100.0)
MPV: 10.5 fL (ref 7.5–12.5)
Platelets: 287 10*3/uL (ref 140–400)
RBC: 4.84 10*6/uL (ref 3.80–5.10)
RDW: 12.3 % (ref 11.0–15.0)
WBC: 9.2 10*3/uL (ref 3.8–10.8)

## 2017-03-04 LAB — TSH: TSH: 0.55 mIU/L

## 2017-03-04 LAB — SEDIMENTATION RATE: Sed Rate: 19 mm/h (ref 0–20)

## 2017-03-04 LAB — C-REACTIVE PROTEIN: CRP: 5.3 mg/L (ref <8.0)

## 2017-03-04 LAB — CK: Total CK: 53 U/L (ref 29–143)

## 2017-03-24 ENCOUNTER — Ambulatory Visit: Payer: BLUE CROSS/BLUE SHIELD | Admitting: Family Medicine

## 2017-03-24 DIAGNOSIS — Z0189 Encounter for other specified special examinations: Secondary | ICD-10-CM

## 2017-03-24 NOTE — Progress Notes (Deleted)
   Subjective:    Patient ID: Brandy Brady, female    DOB: 1971/03/29, 46 y.o.   MRN: [id number]  HPI I last saw her about 4 weeks ago for shortness of breath and we performed spirometry at that time.  She had mentioned during a recent wellness exam that she felt she was just having a difficult time getting a deep breath in.  She had experienced a stroke recently.  Because her curves were slightly abnormal we opted to get a chest x-ray and refer to pulmonology for further evaluation.  Chest x-ray was normal.   Review of Systems     Objective:   Physical Exam        Assessment & Plan:

## 2017-03-26 ENCOUNTER — Institutional Professional Consult (permissible substitution): Payer: BLUE CROSS/BLUE SHIELD | Admitting: Pulmonary Disease

## 2017-03-26 NOTE — Progress Notes (Deleted)
Subjective:    Patient ID: Brandy CloseMaria D Tirado, female    DOB: [birth year], 46 y.o.   MRN: [id number]  Synopsis: referred in 2018 for   HPI No chief complaint on file.  ***  Past Medical History:  Diagnosis Date  . GERD (gastroesophageal reflux disease)   . Migraine   . Obesity   . Stroke [facility] 05/16/15     Family History  Problem Relation Age of Onset  . Cancer - Other Father        liver  . Diabetes Father   . Cancer - Lung Mother   . Cancer - Other Unknown      Social History   Socioeconomic History  . Marital status: Married    Spouse name: Shon  . Number of children: 5  . Years of education: B.S.  . Highest education level: Not on file  Social Needs  . Financial resource strain: Not on file  . Food insecurity - worry: Not on file  . Food insecurity - inability: Not on file  . Transportation needs - medical: Not on file  . Transportation needs - non-medical: Not on file  Occupational History  . 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 Smoking status: Never Smoker  . Smokeless tobacco: Never Used  Substance and Sexual Activity  . Alcohol use: Yes    Alcohol/week: 0.5 oz    Types: 1 Standard drinks or equivalent per week    Comment: socially, none recently  . Drug use: No  . Sexual activity: Yes    Partners: Male    Birth control/protection: Pill, Surgical  Other Topics Concern  . 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. She does Zumba 2x a week for 1 hour .[name]      Lives at home with spouse, children   Caffeine use- soda rarely     No Known Allergies   Outpatient Medications Prior to Visit  Medication Sig Dispense Refill  . aspirin 325 MG tablet Take 1 tablet (325 mg total) by mouth daily. 30 tablet 0  . atorvastatin (LIPITOR) 20 MG tablet Take 1 tablet (20 mg total) by mouth at bedtime. 90 tablet 1   No  facility-administered medications prior to visit.       Review of Systems     Objective:   Physical Exam There were no vitals filed for this visit.   ***  CBC    Component Value Date/Time   WBC 9.2 03/03/2017 1147   RBC 4.84 03/03/2017 1147   HGB 14.3 03/03/2017 1147   HCT 42.0 03/03/2017 1147   PLT 287 03/03/2017 1147   MCV 86.8 03/03/2017 1147   MCH 29.5 03/03/2017 1147   MCHC 34.0 03/03/2017 1147   RDW 12.3 03/03/2017 1147   LYMPHSABS 2,740 02/17/2017 0934   MONOABS 0.7 10/20/2013 1607   EOSABS 302 02/17/2017 0934   BASOSABS 27 02/17/2017 0934   BMET    Component Value Date/Time   NA 139 02/17/2017 0934   K 4.7 02/17/2017 0934   CL 106 02/17/2017 0934   CO2 26 02/17/2017 0934   GLUCOSE 87 02/17/2017 0934   BUN 15 02/17/2017 0934   CREATININE 0.84 02/17/2017 0934   CALCIUM 9.2 02/17/2017 0934   GFRNONAA 85 02/17/2017 0934   GFRAA 98 02/17/2017 0934          Assessment &  Plan:   No diagnosis found.  Discussion: ***    Current Outpatient Medications:  .  aspirin 325 MG tablet, Take 1 tablet (325 mg total) by mouth daily., Disp: 30 tablet, Rfl: 0 .  atorvastatin (LIPITOR) 20 MG tablet, Take 1 tablet (20 mg total) by mouth at bedtime., Disp: 90 tablet, Rfl: 1

## 2017-04-23 ENCOUNTER — Institutional Professional Consult (permissible substitution): Payer: BLUE CROSS/BLUE SHIELD | Admitting: Pulmonary Disease

## 2017-04-23 NOTE — Progress Notes (Deleted)
Synopsis: Referred in *** for ***  Subjective:   PATIENT ID: Brandy Brady GENDER: female DOB: 12-Sep-1970, MRN: [id number]   HPI  No chief complaint on file.   ***  Past Medical History:  Diagnosis Date  . GERD (gastroesophageal reflux disease)   . Migraine   . Obesity   . Stroke [facility](HCC) 05/16/15     Family History  Problem Relation Age of Onset  . Cancer - Other Father        liver  . Diabetes Father   . Cancer - Lung Mother   . Cancer - Other Unknown      Social History   Socioeconomic History  . Marital status: Married    Spouse name: Brandy Brady  . Number of children: 5  . Years of education: B.S.  . Highest education level: Not on file  Social Needs  . Financial resource strain: Not on file  . Food insecurity - worry: Not on file  . Food insecurity - inability: Not on file  . Transportation needs - medical: Not on file  . Transportation needs - non-medical: Not on file  Occupational History  . 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 Smoking status: Never Smoker  . Smokeless tobacco: Never Used  Substance and Sexual Activity  . Alcohol use: Yes    Alcohol/week: 0.5 oz    Types: 1 Standard drinks or equivalent per week    Comment: socially, none recently  . Drug use: No  . Sexual activity: Yes    Partners: Male    Birth control/protection: Pill, Surgical  Other Topics Concern  . 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. She does Zumba 2x a week for 1 hour .Brandy Brady      Lives at home with spouse, children   Caffeine use- soda rarely     No Known Allergies   Outpatient Medications Prior to Visit  Medication Sig Dispense Refill  . aspirin 325 MG tablet Take 1 tablet (325 mg total) by mouth daily. 30 tablet 0  . atorvastatin (LIPITOR) 20 MG tablet Take 1 tablet (20 mg total) by mouth at bedtime. 90 tablet 1   No  facility-administered medications prior to visit.     ROS    Objective:  Physical Exam   There were no vitals filed for this visit.  ***  CBC    Component Value Date/Time   WBC 9.2 03/03/2017 1147   RBC 4.84 03/03/2017 1147   HGB 14.3 03/03/2017 1147   HCT 42.0 03/03/2017 1147   PLT 287 03/03/2017 1147   MCV 86.8 03/03/2017 1147   MCH 29.5 03/03/2017 1147   MCHC 34.0 03/03/2017 1147   RDW 12.3 03/03/2017 1147   LYMPHSABS 2,740 02/17/2017 0934   MONOABS 0.7 10/20/2013 1607   EOSABS 302 02/17/2017 0934   BASOSABS 27 02/17/2017 0934     Chest imaging:  PFT: 02/2017 Spirometry showed Ratio 62%, FEV1 2.19 (75% pred), some improvement with Bronchodilator but not > 12%.    Labs:  Path:  Echo:  Heart Catheterization:  Records from her PCP visit with Dr. Linford ArnoldMetheney reviewed where she was referred to us for asthma/dyspnea     Assessment & Plan:   No diagnosis found.  Discussion: ***    Current Outpatient Medications:  .  aspirin 325 MG tablet, Take 1 tablet (325 mg total) by  mouth daily., Disp: 30 tablet, Rfl: 0 .  atorvastatin (LIPITOR) 20 MG tablet, Take 1 tablet (20 mg total) by mouth at bedtime., Disp: 90 tablet, Rfl: 1

## 2017-06-01 ENCOUNTER — Institutional Professional Consult (permissible substitution): Payer: BLUE CROSS/BLUE SHIELD | Admitting: Pulmonary Disease

## 2017-07-06 ENCOUNTER — Telehealth: Payer: Self-pay

## 2017-07-06 NOTE — Telephone Encounter (Signed)
Byrd HesselbachMaria would like to switch her referral to [facility]-   [facility] [place]  Address: [address], [place], [place] [address]  Phone: [phone]

## 2018-06-13 IMAGING — DX DG CHEST 2V
2 series · 2 of 2 positions shown · non-contrast
Comparison: None.

CLINICAL DATA: Chest pain

EXAM:
CHEST  2 VIEW

[chest pa]
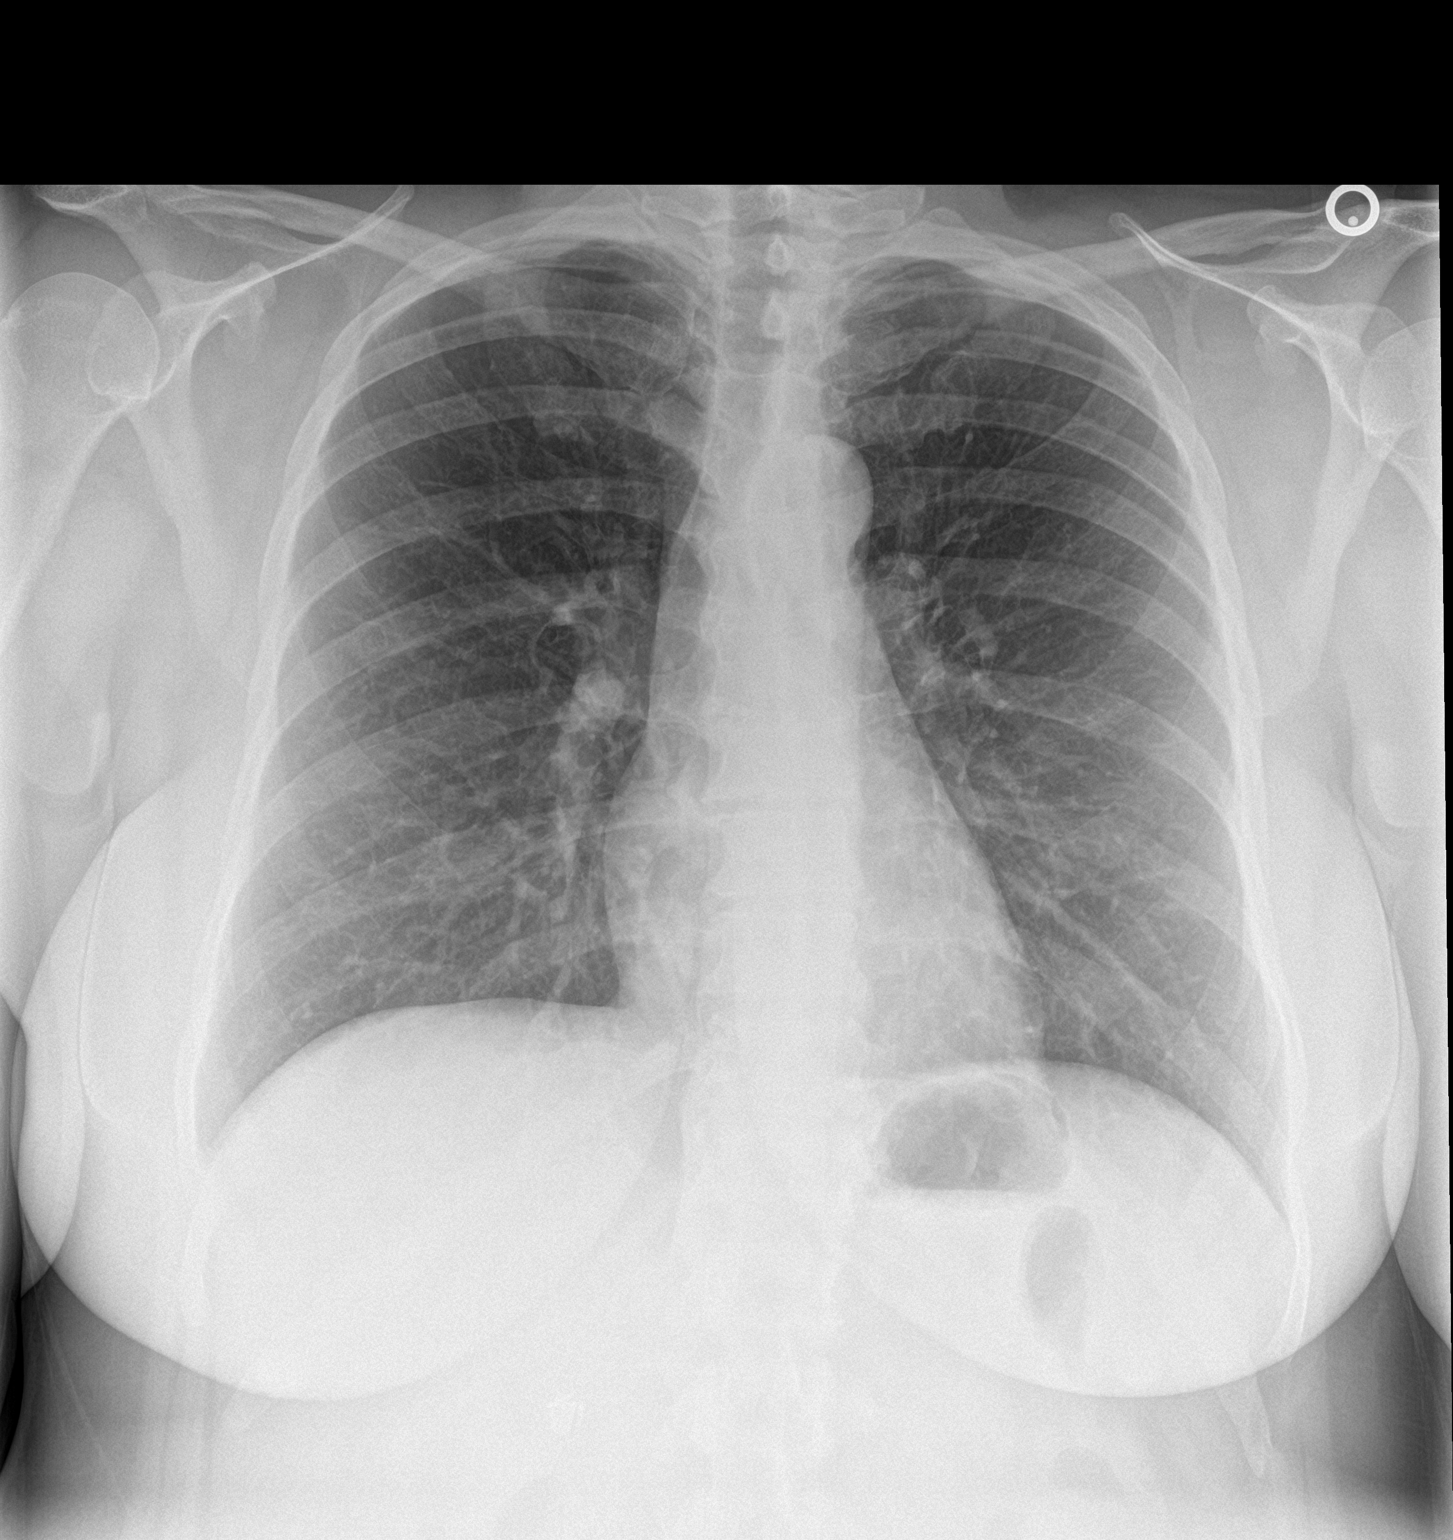

[chest lat]
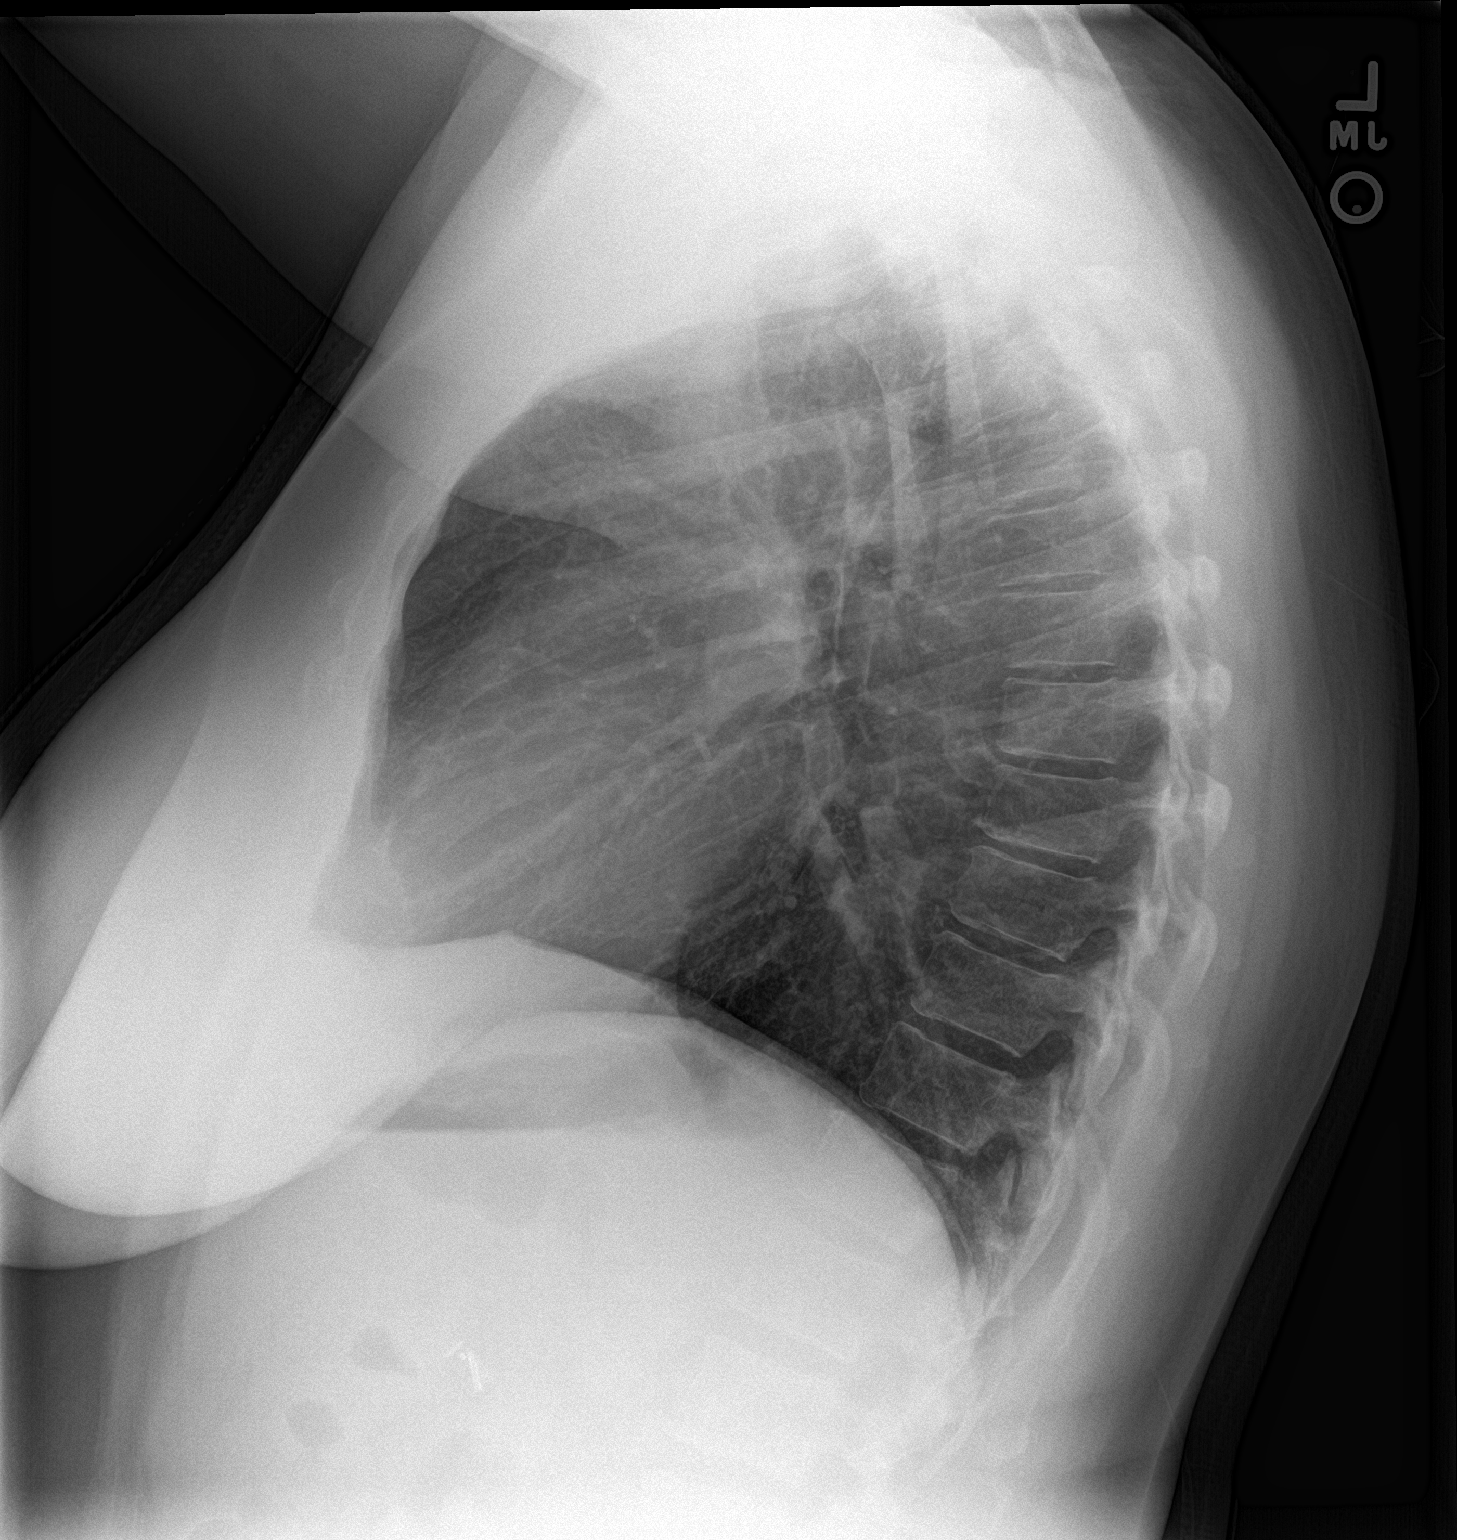

[2 of 2 positions shown; findings below may reference images not displayed]

FINDINGS: Lungs are clear. Heart size and pulmonary vascularity are normal. No
adenopathy. No pneumothorax. No appreciable bone lesions. Surgical
clips are noted in the right upper quadrant of the abdomen.
IMPRESSION: No edema or consolidation.

## 2018-07-01 ENCOUNTER — Encounter: Payer: BLUE CROSS/BLUE SHIELD | Admitting: Family Medicine

## 2019-08-04 ENCOUNTER — Ambulatory Visit: Payer: Self-pay | Attending: Internal Medicine

## 2019-08-04 DIAGNOSIS — Z23 Encounter for immunization: Secondary | ICD-10-CM

## 2019-08-04 NOTE — Progress Notes (Signed)
   Covid-19 Vaccination Clinic  Name:  Ruthellen Tippy    MRN: [id number] DOB: 02-23-1971  08/04/2019  Ms. Minish was observed post Covid-19 immunization for 15 minutes without incident. She was provided with Vaccine Information Sheet and instruction to access the V-Safe system.   Ms. Ardine Bjork was instructed to call 911 with any severe reactions post vaccine: Marland Kitchen Difficulty breathing  . Swelling of face and throat  . A fast heartbeat  . A bad rash all over body  . Dizziness and weakness   Immunizations Administered    Name Date Dose VIS Date Route   Pfizer COVID-19 Vaccine 08/04/2019  1:03 PM 0.3 mL 04/29/2019 Intramuscular   Manufacturer: [employer], [employer]   Lot: [id number]   NDC: [address]-5

## 2019-08-29 ENCOUNTER — Ambulatory Visit: Payer: Self-pay | Attending: Internal Medicine

## 2019-08-29 DIAGNOSIS — Z23 Encounter for immunization: Secondary | ICD-10-CM

## 2019-08-29 NOTE — Progress Notes (Signed)
   Covid-19 Vaccination Clinic  Name:  Brandy Brady    MRN: [id number] DOB: [birth year]  08/29/2019  Brandy Brady was observed post Covid-19 immunization for 15 minutes without incident. She was provided with Vaccine Information Sheet and instruction to access the V-Safe system.   Brandy Brady was instructed to call 911 with any severe reactions post vaccine: Marland Kitchen Difficulty breathing  . Swelling of face and throat  . A fast heartbeat  . A bad rash all over body  . Dizziness and weakness   Immunizations Administered    Name Date Dose VIS Date Route   Pfizer COVID-19 Vaccine 08/29/2019 12:12 PM 0.3 mL 04/29/2019 Intramuscular   Manufacturer: [employer], [employer]   Lot: [id number]   NDC: [address]-3

## 2020-01-26 ENCOUNTER — Other Ambulatory Visit: Payer: Self-pay

## 2020-01-26 ENCOUNTER — Ambulatory Visit: Payer: Self-pay | Admitting: Family Medicine

## 2020-01-26 ENCOUNTER — Ambulatory Visit (INDEPENDENT_AMBULATORY_CARE_PROVIDER_SITE_OTHER): Payer: 59 | Admitting: Family Medicine

## 2020-01-26 ENCOUNTER — Encounter: Payer: Self-pay | Admitting: Family Medicine

## 2020-01-26 VITALS — BP 127/70 | HR 74 | Ht 64.0 in | Wt 235.0 lb

## 2020-01-26 DIAGNOSIS — N920 Excessive and frequent menstruation with regular cycle: Secondary | ICD-10-CM | POA: Insufficient documentation

## 2020-01-26 DIAGNOSIS — M545 Low back pain, unspecified: Secondary | ICD-10-CM

## 2020-01-26 MED ORDER — ORPHENADRINE CITRATE ER 100 MG PO TB12: 100.0000 mg | ORAL_TABLET | Freq: Every day | ORAL | 0 refills | Status: DC

## 2020-01-26 MED ORDER — MELOXICAM 15 MG PO TABS: 15.0000 mg | ORAL_TABLET | Freq: Every day | ORAL | 2 refills | Status: DC

## 2020-01-26 NOTE — Assessment & Plan Note (Signed)
Discussed that I do think this is consistent with musculoskeletal back pain. Avoid heavy lifting. Given exercises for home PT we will also refer her for formal PT over to our [facility] location which is closer to her home. Discontinue naproxen and switch to meloxicam. We will add a muscle relaxer at bedtime. Follow-up in 3 to 4 weeks if not improving.

## 2020-01-26 NOTE — Assessment & Plan Note (Signed)
Discussed options.  Recommend getting an ultrasound for further work-up to rule out other causes such as fibroids etc.  It could be that she is also becoming perimenopausal which can lead to irregular but heavy periods she also needs an up-to-date Pap smear as well as possible STD testing

## 2020-01-26 NOTE — Progress Notes (Signed)
Low back pain on and off worse over last 2 weeks.

## 2020-01-26 NOTE — Progress Notes (Signed)
Established Patient Office Visit  Subjective:  Patient ID: Brandy Brady, female    DOB: July 06, 1970  Age: 49 y.o. MRN: [id number]  CC:  Chief Complaint  Patient presents with  . Back Pain    HPI Brandy Brady presents for mid- low back pain on and off but worse x 2 weeks.  Rates it 7-8/10. Using Naproxen, usually helps some but lately not helping as much.  Has had massage done and that seems to help as well.  The pain is predominantly on the left low back area she denies any radiation down into the legs.. No injury or trauma. Carrying things aggravate it.    Also reports very irregular and heavy periods over the last year.  She says she is had a couple months where she skipped her.  But then would get a very heavy period that would last him was 2 weeks.  She did see primary care for this.  That initial work-up with some labs though did not check hormones and eventually had recommended an ultrasound.  At the time she did not have health insurance so she held off on that.  After talking with 2 of her aunts she suspect she is probably going into menopause soon.  Last Pap smear that patient reported was in 2016, during her last pregnancy.  Past Medical History:  Diagnosis Date  . GERD (gastroesophageal reflux disease)   . Migraine   . Obesity   . Stroke [facility]) 05/16/15    Past Surgical History:  Procedure Laterality Date  . CESAREAN SECTION  01/19/2015  . CHOLECYSTECTOMY    . TUBAL LIGATION  01/2015    Family History  Problem Relation Age of Onset  . Cancer - Other Father        liver  . Diabetes Father   . Cancer - Lung Mother   . Cancer - Other Unknown     Social History   Socioeconomic History  . Marital status: Married    Spouse name: Shon  . Number of children: 5  . Years of education: B.S.  . Highest education level: Not on file  Occupational History  . 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 Smoking  status: Never Smoker  . Smokeless tobacco: Never Used  Substance and Sexual Activity  . Alcohol use: Yes    Alcohol/week: 1.0 standard drink    Types: 1 Standard drinks or equivalent per week    Comment: socially, none recently  . Drug use: No  . Sexual activity: Yes    Partners: Male    Birth control/protection: Pill, Surgical  Other Topics Concern  . Not on file  Social History Narrative   She works as a Occupational psychologist for [employer]. She does Zumba 2x a week for 1 hour .Deno Etienne      Lives at home with spouse, children   Caffeine use- soda rarely   Social Determinants of Health   Financial Resource Strain:   . Difficulty of Paying Living Expenses: Not on file  Food Insecurity:   . Worried About Programme researcher, broadcasting/film/video in the Last Year: Not on file  . Ran Out of Food in the Last Year: Not on file  Transportation Needs:   . Lack of Transportation (Medical): Not on file  . Lack of Transportation (Non-Medical): Not on file  Physical Activity:   . Days of Exercise per Week: Not on file  .  Minutes of Exercise per Session: Not on file  Stress:   . Feeling of Stress : Not on file  Social Connections:   . Frequency of Communication with Friends and Family: Not on file  . Frequency of Social Gatherings with Friends and Family: Not on file  . Attends Religious Services: Not on file  . Active Member of Clubs or Organizations: Not on file  . Attends Banker Meetings: Not on file  . Marital Status: Not on file  Intimate Partner Violence:   . Fear of Current or Ex-Partner: Not on file  . Emotionally Abused: Not on file  . Physically Abused: Not on file  . Sexually Abused: Not on file    Outpatient Medications Prior to Visit  Medication Sig Dispense Refill  . aspirin 325 MG tablet Take 1 tablet (325 mg total) by mouth daily. 30 tablet 0  . atorvastatin (LIPITOR) 20 MG tablet Take 1 tablet (20 mg total) by mouth at bedtime. 90 tablet 1    No facility-administered medications prior to visit.    No Known Allergies    ROS Review of Systems    Objective:    Physical Exam Vitals reviewed.  Constitutional:      Appearance: She is well-developed.  HENT:     Head: Normocephalic and atraumatic.  Eyes:     Conjunctiva/sclera: Conjunctivae normal.  Cardiovascular:     Rate and Rhythm: Normal rate.  Pulmonary:     Effort: Pulmonary effort is normal.  Musculoskeletal:     Comments: Normal lumbar flexion, extension, rotation and side bending. She did have some discomfort with full flexion and with side bending to the left. Negative straight leg raise bilaterally. Hip, knee, ankle strength is 5-5 bilaterally. Patellar reflex 0+ bilaterally. Nontender over the thoracic spine she is tender over the mid lumbar spine and left-sided paraspinous muscles. Nontender of the SI joint.  Skin:    General: Skin is dry.     Coloration: Skin is not pale.  Neurological:     Mental Status: She is alert and oriented to person, place, and time.  Psychiatric:        Behavior: Behavior normal.     BP 127/70   Pulse 74   Ht 5\' 4"  (1.626 m)   Wt 235 lb (106.6 kg)   LMP 12/22/2019 (Approximate)   SpO2 100%   BMI 40.34 kg/m  Wt Readings from Last 3 Encounters:  01/26/20 235 lb (106.6 kg)  02/17/17 220 lb (99.8 kg)  06/04/15 206 lb 12.8 oz (93.8 kg)     There are no preventive care reminders to display for this patient.  There are no preventive care reminders to display for this patient.  Lab Results  Component Value Date   TSH 0.55 03/03/2017   Lab Results  Component Value Date   WBC 9.2 03/03/2017   HGB 14.3 03/03/2017   HCT 42.0 03/03/2017   MCV 86.8 03/03/2017   PLT 287 03/03/2017   Lab Results  Component Value Date   NA 139 02/17/2017   K 4.7 02/17/2017   CO2 26 02/17/2017   GLUCOSE 87 02/17/2017   BUN 15 02/17/2017   CREATININE 0.84 02/17/2017   BILITOT 0.5 02/17/2017   ALKPHOS 86 04/30/2015   AST 13  02/17/2017   ALT 12 02/17/2017   PROT 6.8 02/17/2017   ALBUMIN 3.9 04/30/2015   CALCIUM 9.2 02/17/2017   ANIONGAP 7 05/16/2015   Lab Results  Component Value Date   CHOL  181 02/17/2017   Lab Results  Component Value Date   HDL 60 02/17/2017   Lab Results  Component Value Date   LDLCALC 105 (H) 02/17/2017   Lab Results  Component Value Date   TRIG 70 02/17/2017   Lab Results  Component Value Date   CHOLHDL 3.0 02/17/2017   Lab Results  Component Value Date   HGBA1C 5.3 05/17/2015      Assessment & Plan:   Problem List Items Addressed This Visit      Other   Menorrhagia with regular cycle    Discussed options.  Recommend getting an ultrasound for further work-up to rule out other causes such as fibroids etc.  It could be that she is also becoming perimenopausal which can lead to irregular but heavy periods she also needs an up-to-date Pap smear as well as possible STD testing      Relevant Orders   US Pelvic Complete With Transvaginal   Acute left-sided low back pain without sciatica - Primary    Discussed that I do think this is consistent with musculoskeletal back pain. Avoid heavy lifting. Given exercises for home PT we will also refer her for formal PT over to our [facility] location which is closer to her home. Discontinue naproxen and switch to meloxicam. We will add a muscle relaxer at bedtime. Follow-up in 3 to 4 weeks if not improving.      Relevant Medications   orphenadrine (NORFLEX) 100 MG tablet   meloxicam (MOBIC) 15 MG tablet   Other Relevant Orders   Ambulatory referral to Physical Therapy      Meds ordered this encounter  Medications  . orphenadrine (NORFLEX) 100 MG tablet    Sig: Take 1 tablet (100 mg total) by mouth at bedtime. PRN    Dispense:  30 tablet    Refill:  0  . meloxicam (MOBIC) 15 MG tablet    Sig: Take 1 tablet (15 mg total) by mouth daily. For Back Pain    Dispense:  30 tablet    Refill:  2    Follow-up: Return in  about 4 weeks (around 02/23/2020) for Physical and possible labs.    Nani Gasser, MD

## 2020-02-02 ENCOUNTER — Ambulatory Visit (HOSPITAL_BASED_OUTPATIENT_CLINIC_OR_DEPARTMENT_OTHER): Payer: 59

## 2020-02-02 ENCOUNTER — Ambulatory Visit: Payer: 59 | Attending: Family Medicine | Admitting: Physical Therapy

## 2020-02-20 ENCOUNTER — Ambulatory Visit: Payer: 59 | Attending: Family Medicine | Admitting: Physical Therapy

## 2020-02-23 ENCOUNTER — Encounter: Payer: 59 | Admitting: Family Medicine

## 2020-02-24 ENCOUNTER — Ambulatory Visit (HOSPITAL_BASED_OUTPATIENT_CLINIC_OR_DEPARTMENT_OTHER): Admission: RE | Admit: 2020-02-24 | Payer: 59 | Source: Ambulatory Visit

## 2020-12-07 ENCOUNTER — Other Ambulatory Visit: Payer: Self-pay

## 2020-12-07 ENCOUNTER — Encounter (HOSPITAL_BASED_OUTPATIENT_CLINIC_OR_DEPARTMENT_OTHER): Payer: Self-pay | Admitting: *Deleted

## 2020-12-07 ENCOUNTER — Emergency Department (HOSPITAL_BASED_OUTPATIENT_CLINIC_OR_DEPARTMENT_OTHER)
Admission: EM | Admit: 2020-12-07 | Discharge: 2020-12-08 | Disposition: A | Discharge status: Home / Self Care | Payer: Medicaid Other | Attending: Emergency Medicine | Admitting: Emergency Medicine

## 2020-12-07 ENCOUNTER — Emergency Department (HOSPITAL_BASED_OUTPATIENT_CLINIC_OR_DEPARTMENT_OTHER): Payer: Medicaid Other

## 2020-12-07 DIAGNOSIS — M546 Pain in thoracic spine: Secondary | ICD-10-CM | POA: Diagnosis not present

## 2020-12-07 DIAGNOSIS — M545 Low back pain, unspecified: Secondary | ICD-10-CM | POA: Diagnosis not present

## 2020-12-07 DIAGNOSIS — M79672 Pain in left foot: Secondary | ICD-10-CM

## 2020-12-07 DIAGNOSIS — W19XXXA Unspecified fall, initial encounter: Secondary | ICD-10-CM

## 2020-12-07 DIAGNOSIS — M79675 Pain in left toe(s): Secondary | ICD-10-CM | POA: Diagnosis not present

## 2020-12-07 DIAGNOSIS — W108XXA Fall (on) (from) other stairs and steps, initial encounter: Secondary | ICD-10-CM | POA: Insufficient documentation

## 2020-12-07 NOTE — ED Triage Notes (Addendum)
She slipped and fell backward. She did not hit the back of her head. No LOC. Back pain. Left 4th toe pain. She is ambulatory.

## 2020-12-07 NOTE — ED Provider Notes (Signed)
[facility] Emergency Department Provider Note MRN:  [id number]  Arrival date & time: 12/07/20     Chief Complaint   Fall   History of Present Illness   Brandy Brady is a 50 y.o. year-old female with a history of stroke presenting to the ED with chief complaint of fall.  Patient fell while walking down some stairs.  Fell back with the stairs striking her back.  Did not hit her head, did not pass out, denies any neck pain, no chest pain or shortness of breath, endorsing some right-sided thoracic and lumbar back pain.  Denies any numbness or weakness to the arms or legs, no abdominal pain, also endorsing some left foot and toe pain.  Pain is moderate, constant, getting worse throughout the day.  Review of Systems  A complete 10 system review of systems was obtained and all systems are negative except as noted in the HPI and PMH.   Patient's Health History    Past Medical History:  Diagnosis Date   GERD (gastroesophageal reflux disease)    Migraine    Obesity    Stroke (HCC) 05/16/15    Past Surgical History:  Procedure Laterality Date   CESAREAN SECTION  01/19/2015   CHOLECYSTECTOMY     TUBAL LIGATION  01/18/2015   TUBAL LIGATION      Family History  Problem Relation Age of Onset   Cancer - Other Father        liver   Diabetes Father    Cancer - Lung Mother    Cancer - Other Other     Social History   Socioeconomic History   Marital status: Married    Spouse name: Shon   Number of children: 5   Years of education: B.S.   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 a week for 1 hour .Deno Etienne      Lives at home with spouse, children   Caffeine use- soda rarely   Social Determinants of Health   Financial Resource Strain: Not on file  Food Insecurity: Not on file  Transportation Needs: Not on file  Physical Activity: Not on file  Stress: Not on file  Social Connections: Not on file  Intimate Partner Violence: Not on file     Physical Exam   Vitals:   12/07/20 2149  BP: 134/84  Pulse: 68  Resp: 20  Temp: 98.7 F (37.1 C)  SpO2: 100%    CONSTITUTIONAL: Well-appearing, NAD NEURO:  Alert and oriented x 3, no focal deficits EYES:  eyes equal and reactive ENT/NECK:  no LAD, no JVD CARDIO: Regular rate, well-perfused, normal S1 and S2 PULM:  CTAB no wheezing or rhonchi GI/GU:  normal bowel sounds, non-distended, non-tender MSK/SPINE:  No gross deformities, no  edema SKIN:  no rash, atraumatic PSYCH:  Appropriate speech and behavior  *Additional and/or pertinent findings included in MDM below  Diagnostic and Interventional Summary    EKG Interpretation  Date/Time:    Ventricular Rate:    PR Interval:    QRS Duration:   QT Interval:    QTC Calculation:   R Axis:     Text Interpretation:         Labs Reviewed - No data to display  DG Foot Complete Left  Final Result      Medications - No data to display   Procedures  /  Critical Care Procedures  ED Course and Medical Decision Making  I have reviewed the triage vital signs, the nursing notes, and pertinent available records from the EMR.  Listed above are laboratory and imaging tests that I personally ordered, reviewed, and interpreted and then considered in my medical decision making (see below for  details).  Normal vital signs, overall reassuring exam.  Bilateral breath sounds, nontender abdomen, no CT or L midline spinal tenderness, suspect bruising and muscular strain of the back.  No numbness or weakness to the arms or legs, normal neurological exam.  Foot is tender to the midfoot as well as the fourth toe on the left, x-ray revealing question of proximal metatarsal fracture, will refer to orthopedics.  Patient takes daily full dose aspirin but overall nothing to suggest traumatic bleeding, fall occurred greater than 12 hours ago and she is well-appearing with normal vital signs.  Appropriate for discharge.       Elmer Sow. Pilar Plate, MD [facility] Emergency Medicine [facility] [email or web address]  Final Clinical Impressions(s) / ED Diagnoses     ICD-10-CM   1. Fall, initial encounter  W19.XXXA     2. Left foot pain  M79.672       ED Discharge Orders     None        Discharge Instructions Discussed with and Provided to Patient:    Discharge Instructions      You were evaluated in the Emergency Department and after careful evaluation, we did not find any emergent condition requiring admission or further testing in the hospital.  Your exam/testing today is overall reassuring.  Symptoms seem to be mostly due to bruising from the fall.  Your x-ray showed a possible fracture of the foot.  Recommend follow-up with the orthopedic specialist for repeat evaluation.  Recommend Tylenol or Motrin for pain, recommend using the postop shoe provided.  Please return to the Emergency Department if you experience any worsening of your condition.   Thank you for allowing [place] to be a part of your care.       Sabas Sous, MD 12/07/20 [phone]

## 2020-12-07 NOTE — Discharge Instructions (Addendum)
You were evaluated in the Emergency Department and after careful evaluation, we did not find any emergent condition requiring admission or further testing in the hospital.  Your exam/testing today is overall reassuring.  Symptoms seem to be mostly due to bruising from the fall.  Your x-ray showed a possible fracture of the foot.  Recommend follow-up with the orthopedic specialist for repeat evaluation.  Recommend Tylenol or Motrin for pain, recommend using the postop shoe provided.  Please return to the Emergency Department if you experience any worsening of your condition.   Thank you for allowing [place] to be a part of your care.

## 2022-11-05 ENCOUNTER — Emergency Department (HOSPITAL_BASED_OUTPATIENT_CLINIC_OR_DEPARTMENT_OTHER): Payer: No Typology Code available for payment source

## 2022-11-05 ENCOUNTER — Emergency Department (HOSPITAL_BASED_OUTPATIENT_CLINIC_OR_DEPARTMENT_OTHER)
Admission: EM | Admit: 2022-11-05 | Discharge: 2022-11-05 | Disposition: A | Discharge status: Home / Self Care | Payer: No Typology Code available for payment source | Attending: Emergency Medicine | Admitting: Emergency Medicine

## 2022-11-05 ENCOUNTER — Encounter (HOSPITAL_BASED_OUTPATIENT_CLINIC_OR_DEPARTMENT_OTHER): Payer: Self-pay | Admitting: Emergency Medicine

## 2022-11-05 ENCOUNTER — Other Ambulatory Visit: Payer: Self-pay

## 2022-11-05 DIAGNOSIS — M542 Cervicalgia: Secondary | ICD-10-CM | POA: Diagnosis not present

## 2022-11-05 DIAGNOSIS — M545 Low back pain, unspecified: Secondary | ICD-10-CM | POA: Diagnosis not present

## 2022-11-05 DIAGNOSIS — R519 Headache, unspecified: Secondary | ICD-10-CM | POA: Insufficient documentation

## 2022-11-05 DIAGNOSIS — Y92410 Unspecified street and highway as the place of occurrence of the external cause: Secondary | ICD-10-CM | POA: Insufficient documentation

## 2022-11-05 MED ORDER — METHOCARBAMOL 500 MG PO TABS: 500.0000 mg | ORAL_TABLET | Freq: Two times a day (BID) | ORAL | 0 refills | Status: AC

## 2022-11-05 MED ORDER — NAPROXEN 375 MG PO TABS: 375.0000 mg | ORAL_TABLET | Freq: Two times a day (BID) | ORAL | 0 refills | Status: AC

## 2022-11-05 NOTE — Discharge Instructions (Addendum)
The CT of your head, cervical spine, lumbar spine were within normal limits today.  You were given a prescription for Robaxin, this is a muscle relaxer and can make you drowsy, do not drink alcohol or drive while taking this medication.  In addition, you were given a prescription for naproxen, please take 1 tablet twice a day with food for 7 days.  In addition you may benefit from some ice or heat to your back and neck.

## 2022-11-05 NOTE — ED Notes (Signed)
D/c paperwork reviewed with pt, including prescriptions and follow up care.  No questions or concerns voiced at time of d/c. . Pt verbalized understanding, Ambulatory without assistance to ED exit, NAD.   
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
